# Patient Record
Sex: Female | Born: 1995 | Race: Black or African American | Hispanic: No | Marital: Single | Smoking: Current every day smoker
Health system: Southern US, Community
[De-identification: ages and names within clinical notes are randomized; demographics above are authoritative.]

## PROBLEM LIST (undated history)

## (undated) ENCOUNTER — Emergency Department (HOSPITAL_COMMUNITY): Admission: EM | Payer: Medicaid Other | Source: Home / Self Care

## (undated) DIAGNOSIS — Z789 Other specified health status: Secondary | ICD-10-CM

## (undated) HISTORY — PX: NO PAST SURGERIES: SHX2092

---
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
## 2015-02-26 ENCOUNTER — Encounter (HOSPITAL_COMMUNITY): Payer: Self-pay | Admitting: Emergency Medicine

## 2015-02-26 ENCOUNTER — Emergency Department (HOSPITAL_COMMUNITY)
Admission: EM | Admit: 2015-02-26 | Discharge: 2015-02-26 | Discharge status: Absent without leave | Payer: Self-pay | Source: Home / Self Care

## 2015-02-26 ENCOUNTER — Emergency Department (HOSPITAL_COMMUNITY)
Admission: EM | Admit: 2015-02-26 | Discharge: 2015-02-27 | Disposition: A | Discharge status: Home / Self Care | Payer: Medicaid Other | Attending: Emergency Medicine | Admitting: Emergency Medicine

## 2015-02-26 ENCOUNTER — Emergency Department (HOSPITAL_COMMUNITY)
Admission: EM | Admit: 2015-02-26 | Discharge: 2015-02-26 | Disposition: A | Discharge status: Home / Self Care | Payer: Medicaid Other

## 2015-02-26 DIAGNOSIS — R110 Nausea: Secondary | ICD-10-CM | POA: Insufficient documentation

## 2015-02-26 DIAGNOSIS — Z79899 Other long term (current) drug therapy: Secondary | ICD-10-CM | POA: Insufficient documentation

## 2015-02-26 DIAGNOSIS — J111 Influenza due to unidentified influenza virus with other respiratory manifestations: Secondary | ICD-10-CM | POA: Insufficient documentation

## 2015-02-26 DIAGNOSIS — R109 Unspecified abdominal pain: Secondary | ICD-10-CM | POA: Insufficient documentation

## 2015-02-26 DIAGNOSIS — R69 Illness, unspecified: Secondary | ICD-10-CM

## 2015-02-26 LAB — URINALYSIS, ROUTINE W REFLEX MICROSCOPIC
BILIRUBIN URINE: NEGATIVE
Glucose, UA: NEGATIVE mg/dL
Hgb urine dipstick: NEGATIVE
KETONES UR: 15 mg/dL — AB
LEUKOCYTES UA: NEGATIVE
NITRITE: NEGATIVE
PH: 5.5 (ref 5.0–8.0)
PROTEIN: 100 mg/dL — AB
Specific Gravity, Urine: 1.034 — ABNORMAL HIGH (ref 1.005–1.030)

## 2015-02-26 LAB — CBC
HEMATOCRIT: 39.7 % (ref 36.0–46.0)
HEMOGLOBIN: 12.7 g/dL (ref 12.0–15.0)
MCH: 26 pg (ref 26.0–34.0)
MCHC: 32 g/dL (ref 30.0–36.0)
MCV: 81.4 fL (ref 78.0–100.0)
Platelets: 212 10*3/uL (ref 150–400)
RBC: 4.88 MIL/uL (ref 3.87–5.11)
RDW: 13.4 % (ref 11.5–15.5)
WBC: 3.4 10*3/uL — AB (ref 4.0–10.5)

## 2015-02-26 LAB — COMPREHENSIVE METABOLIC PANEL
ALT: 14 U/L (ref 14–54)
ANION GAP: 9 (ref 5–15)
AST: 31 U/L (ref 15–41)
Albumin: 3.3 g/dL — ABNORMAL LOW (ref 3.5–5.0)
Alkaline Phosphatase: 41 U/L (ref 38–126)
BILIRUBIN TOTAL: 0.2 mg/dL — AB (ref 0.3–1.2)
BUN: 12 mg/dL (ref 6–20)
CO2: 21 mmol/L — ABNORMAL LOW (ref 22–32)
Calcium: 8.8 mg/dL — ABNORMAL LOW (ref 8.9–10.3)
Chloride: 109 mmol/L (ref 101–111)
Creatinine, Ser: 0.75 mg/dL (ref 0.44–1.00)
Glucose, Bld: 88 mg/dL (ref 65–99)
POTASSIUM: 3.5 mmol/L (ref 3.5–5.1)
Sodium: 139 mmol/L (ref 135–145)
TOTAL PROTEIN: 5.8 g/dL — AB (ref 6.5–8.1)

## 2015-02-26 LAB — LIPASE, BLOOD: LIPASE: 56 U/L — AB (ref 11–51)

## 2015-02-26 LAB — URINE MICROSCOPIC-ADD ON

## 2015-02-26 MED ORDER — ONDANSETRON HCL 4 MG/2ML IJ SOLN
4.0000 mg | Freq: Once | INTRAMUSCULAR | Status: AC
  Administered 2015-02-26: 4 mg via INTRAVENOUS
  Filled 2015-02-26: qty 2

## 2015-02-26 MED ORDER — METOCLOPRAMIDE HCL 5 MG/ML IJ SOLN
10.0000 mg | Freq: Once | INTRAMUSCULAR | Status: AC
  Administered 2015-02-26: 10 mg via INTRAVENOUS
  Filled 2015-02-26: qty 2

## 2015-02-26 MED ORDER — ONDANSETRON 4 MG PO TBDP: 4.0000 mg | ORAL_TABLET | Freq: Once | ORAL | Status: DC

## 2015-02-26 MED ORDER — SODIUM CHLORIDE 0.9 % IV BOLUS (SEPSIS): 1000.0000 mL | Freq: Once | INTRAVENOUS | Status: DC

## 2015-02-26 MED ORDER — SODIUM CHLORIDE 0.9 % IV BOLUS (SEPSIS)
1000.0000 mL | Freq: Once | INTRAVENOUS | Status: AC
  Administered 2015-02-26: 1000 mL via INTRAVENOUS

## 2015-02-26 NOTE — ED Notes (Signed)
Pt from home for eval of generalized body aches with nasal congestion, cough, and nausea. Pt states started Friday, denies any sob or cp at this time. Pt has been arnd sick friend.

## 2015-02-26 NOTE — ED Provider Notes (Signed)
CSN: [id number]     Arrival date & time 02/26/15  1512 History   First MD Initiated Contact with Patient 02/26/15 2058     Chief Complaint  Patient presents with  . Generalized Body Aches  . Nausea  . Abdominal Pain    (Consider location/radiation/quality/duration/timing/severity/associated sxs/prior Treatment) HPI Comments: Patient presents with three-day history of URI symptoms, headache, cough, nausea and vomiting. Patient denies chest pain, shortness of breath, abdominal pain. No diarrhea or fever. No urinary symptoms. Patient notes sick contacts. Vomiting is improved today but she still feels very nauseous. She also complains of frontal headache without photophobia or phonophobia. No treatments prior to arrival. Onset of symptoms acute. Course is constant. Nothing makes symptoms better or worse.  Patient is a 20 y.o. female presenting with abdominal pain. The history is provided by the patient.  Abdominal Pain Associated symptoms: cough, fatigue, nausea and vomiting   Associated symptoms: no chest pain, no chills, no diarrhea, no dysuria, no fever, no shortness of breath and no sore throat     History reviewed. No pertinent past medical history. History reviewed. No pertinent past surgical history. No family history on file. Social History  Substance Use Topics  . Smoking status: Never Smoker   . Smokeless tobacco: None  . Alcohol Use: No   OB History    No data available     Review of Systems  Constitutional: Positive for fatigue. Negative for fever and chills.  HENT: Positive for congestion and rhinorrhea. Negative for dental problem, ear pain, sinus pressure and sore throat.   Eyes: Negative for photophobia, discharge, redness and visual disturbance.  Respiratory: Positive for cough. Negative for shortness of breath and wheezing.   Cardiovascular: Negative for chest pain.  Gastrointestinal: Positive for nausea and vomiting. Negative for abdominal pain and diarrhea.   Genitourinary: Negative for dysuria and frequency.  Musculoskeletal: Negative for myalgias, gait problem, neck pain and neck stiffness.  Skin: Negative for rash.  Neurological: Positive for headaches. Negative for syncope, speech difficulty, weakness, light-headedness and numbness.  Hematological: Negative for adenopathy.  Psychiatric/Behavioral: Negative for confusion.    Allergies  Review of patient's allergies indicates no known allergies.  Home Medications   Prior to Admission medications   Medication Sig Start Date End Date Taking? Authorizing Provider  ibuprofen (ADVIL,MOTRIN) 200 MG tablet Take 400 mg by mouth every 6 (six) hours as needed for headache.   Yes Historical Provider, MD  carbamide peroxide (DEBROX) 6.5 % otic solution Place 5 drops into both ears 2 (two) times daily. 10/27/12   Rhetta Mura, MD  diphenhydrAMINE (BENADRYL MAXIMUM STRENGTH) 2 % cream Apply topically 3 (three) times daily as needed for itching. 10/27/12   Rhetta Mura, MD  loratadine (CLARITIN) 5 MG chewable tablet Chew 1 tablet (5 mg total) by mouth daily. 10/27/12   Rhetta Mura, MD  permethrin (ELIMITE) 5 % cream Apply to affected area once, reapply in one week. 11/15/11   Niel Hummer, MD   BP 107/71 mmHg  Pulse 81  Temp(Src) 98.2 F (36.8 C) (Oral)  Resp 16  SpO2 98%   Physical Exam  Constitutional: She appears well-developed and well-nourished.  HENT:  Head: Normocephalic and atraumatic.  Right Ear: Tympanic membrane, external ear and ear canal normal.  Left Ear: Tympanic membrane, external ear and ear canal normal.  Nose: Nose normal. No mucosal edema or rhinorrhea.  Mouth/Throat: Uvula is midline, oropharynx is clear and moist and mucous membranes are normal. Mucous membranes are not dry. No  oral lesions. No trismus in the jaw. No uvula swelling. No oropharyngeal exudate, posterior oropharyngeal edema, posterior oropharyngeal erythema or tonsillar abscesses.  Eyes:  Conjunctivae are normal. Right eye exhibits no discharge. Left eye exhibits no discharge.  Neck: Normal range of motion. Neck supple.  Cardiovascular: Normal rate, regular rhythm and normal heart sounds.   Pulmonary/Chest: Effort normal and breath sounds normal. No respiratory distress. She has no wheezes. She has no rales.  Abdominal: Soft. There is no tenderness. There is no rebound and no guarding.  Lymphadenopathy:    She has no cervical adenopathy.  Neurological: She is alert.  Skin: Skin is warm and dry.  Psychiatric: She has a normal mood and affect.  Nursing note and vitals reviewed.   ED Course  Procedures (including critical care time) Labs Review Labs Reviewed  LIPASE, BLOOD - Abnormal; Notable for the following:    Lipase 56 (*)    All other components within normal limits  COMPREHENSIVE METABOLIC PANEL - Abnormal; Notable for the following:    CO2 21 (*)    Calcium 8.8 (*)    Total Protein 5.8 (*)    Albumin 3.3 (*)    Total Bilirubin 0.2 (*)    All other components within normal limits  CBC - Abnormal; Notable for the following:    WBC 3.4 (*)    All other components within normal limits  URINALYSIS, ROUTINE W REFLEX MICROSCOPIC (NOT AT [facility]) - Abnormal; Notable for the following:    Color, Urine AMBER (*)    APPearance CLOUDY (*)    Specific Gravity, Urine 1.034 (*)    Ketones, ur 15 (*)    Protein, ur 100 (*)    All other components within normal limits  URINE MICROSCOPIC-ADD ON - Abnormal; Notable for the following:    Squamous Epithelial / LPF 6-30 (*)    Bacteria, UA MANY (*)    All other components within normal limits    Imaging Review No results found. I have personally reviewed and evaluated these images and lab results as part of my medical decision-making.   EKG Interpretation None       9:15 PM Patient seen and examined. Tx fluids, reglan/benadryl.   Vital signs reviewed and are as follows: BP 107/71 mmHg  Pulse 81  Temp(Src) 98.2 F  (36.8 C) (Oral)  Resp 16  SpO2 98%  12:32 AM Patient feeling better and requesting discharge. HA improved. Will d/c to home with zofran.   Patient discharged to home. Encouraged to rest and drink plenty of fluids. Patient told to return to ED or see their primary doctor if their symptoms worsen, high fever not controlled with tylenol, persistent vomiting, they feel they are dehydrated, or if they have any other concerns.  Patient verbalized understanding and agreed with plan.    MDM   Final diagnoses:  Influenza-like illness   Patient with symptoms consistent with influenza. Vitals are stable, low-grade fever reported at home. No signs of dehydration, tolerating PO's. Lungs are clear. Supportive therapy indicated with return if symptoms worsen. Patient counseled. Headache without meningismus, altered LOC, or neuro deficit. UA questionable for UTI however she has no urinary sx and no signs of pyelonephritis.      Renne Crigler, PA-C 02/27/15 0036  Benjiman Core, MD 02/27/15 [phone]

## 2015-02-27 MED ORDER — ONDANSETRON 4 MG PO TBDP: 4.0000 mg | ORAL_TABLET | Freq: Three times a day (TID) | ORAL | Status: DC | PRN

## 2015-02-27 NOTE — Discharge Instructions (Signed)
Please read and follow all provided instructions.  Your diagnoses today include:  1. Influenza-like illness    Tests performed today include:  Vital signs. See below for your results today.   Medications prescribed:   Zofran (ondansetron) - for nausea and vomiting  Take any prescribed medications only as directed.  Home care instructions:  Follow any educational materials contained in this packet. Please continue drinking plenty of fluids. Use over-the-counter cold and flu medications as needed as directed on packaging for symptom relief. You may also use ibuprofen or tylenol as directed on packaging for pain or fever.   BE VERY CAREFUL not to take multiple medicines containing Tylenol (also called acetaminophen). Doing so can lead to an overdose which can damage your liver and cause liver failure and possibly death.   Follow-up instructions: Please follow-up with your primary care provider in the next 3 days for further evaluation of your symptoms.   Return instructions:   Please return to the Emergency Department if you experience worsening symptoms.  Please return if you have a high fever greater than 101 degrees not controlled with over-the-counter medications, persistent vomiting and cannot keep down fluids, or worsening trouble breathing.  Please return if you have any other emergent concerns.  Additional Information:  Your vital signs today were: BP 94/76 mmHg   Pulse 86   Temp(Src) 98.2 F (36.8 C) (Oral)   Resp 16   SpO2 100% If your blood pressure (BP) was elevated above 135/85 this visit, please have this repeated by your doctor within one month.

## 2016-05-21 ENCOUNTER — Encounter (HOSPITAL_COMMUNITY): Payer: Self-pay | Admitting: *Deleted

## 2016-05-21 ENCOUNTER — Emergency Department (HOSPITAL_COMMUNITY)
Admission: EM | Admit: 2016-05-21 | Discharge: 2016-05-21 | Disposition: A | Discharge status: Home / Self Care | Payer: BLUE CROSS/BLUE SHIELD | Attending: Emergency Medicine | Admitting: Emergency Medicine

## 2016-05-21 DIAGNOSIS — R220 Localized swelling, mass and lump, head: Secondary | ICD-10-CM | POA: Diagnosis present

## 2016-05-21 DIAGNOSIS — K047 Periapical abscess without sinus: Secondary | ICD-10-CM | POA: Diagnosis not present

## 2016-05-21 MED ORDER — AMOXICILLIN 500 MG PO CAPS: 500.0000 mg | ORAL_CAPSULE | Freq: Three times a day (TID) | ORAL | 0 refills | Status: DC

## 2016-05-21 MED ORDER — TRAMADOL HCL 50 MG PO TABS: 50.0000 mg | ORAL_TABLET | Freq: Four times a day (QID) | ORAL | 0 refills | Status: DC | PRN

## 2016-05-21 NOTE — ED Triage Notes (Signed)
Pt c/o L facial swelling onset yesterday, pt reports possible cavities to L upper teeth, pt has discoloration to inside of mouth on L side, no obvious broken teeth or caries, denies fever & chills, denies inability to swallow, A&O x4

## 2016-05-21 NOTE — ED Provider Notes (Signed)
[facility]PT Provider Note   CSN: [id number] Arrival date & time: 05/21/16  1215     History   Chief Complaint Chief Complaint  Patient presents with  . Facial Swelling    HPI Meghan Warren is a 21 y.o. female who presents emergency Department with chief complaint of left facial swelling. Patient complains of pain in her left upper molar that has been intermittent for some time. She feels that the tooth is a little bit wiggly when she presses it with her tongue. She does smoke daily and smokes both black and mild cigars, but denies cigarette abuse. Patient states that her pain is worse today with swelling into her left upper face. She denies any difficulty swallowing or fevers. She does not have any current dental follow-up.  HPI  History reviewed. No pertinent past medical history.  There are no active problems to display for this patient.   History reviewed. No pertinent surgical history.  OB History    No data available       Home Medications    Prior to Admission medications   Medication Sig Start Date End Date Taking? Authorizing Provider  amoxicillin (AMOXIL) 500 MG capsule Take 1 capsule (500 mg total) by mouth 3 (three) times daily. 05/21/16   Arthor Captain, PA-C  ibuprofen (ADVIL,MOTRIN) 200 MG tablet Take 400 mg by mouth every 6 (six) hours as needed for headache.    [provider]  ondansetron (ZOFRAN ODT) 4 MG disintegrating tablet Take 1 tablet (4 mg total) by mouth every 8 (eight) hours as needed for nausea or vomiting. 02/27/15   Renne Crigler, PA-C  traMADol (ULTRAM) 50 MG tablet Take 1 tablet (50 mg total) by mouth every 6 (six) hours as needed. 05/21/16   Arthor Captain, PA-C    Family History No family history on file.  Social History Social History  Substance Use Topics  . Smoking status: Never Smoker  . Smokeless tobacco: Never Used  . Alcohol use No     Allergies   Patient has no known allergies.   Review of  Systems Review of Systems  Constitutional: Negative for chills and fever.  HENT: Positive for dental problem and facial swelling.      Physical Exam Updated Vital Signs BP 111/84 (BP Location: Right Arm)   Pulse 74   Temp 98.2 F (36.8 C) (Oral)   Resp 14   Ht 5\' 3"  (1.6 m)   LMP 05/21/2016   SpO2 100%   Physical Exam  Constitutional: She is oriented to person, place, and time. She appears well-developed and well-nourished. No distress.  HENT:  Head: Normocephalic and atraumatic.  Left upper second molar is tender to palpation, loose in the joint. No overt swelling or signs of abscess. Patient's face is swollen over the left cheek.  Eyes: Conjunctivae are normal. No scleral icterus.  Neck: Normal range of motion.  Cardiovascular: Normal rate, regular rhythm and normal heart sounds.  Exam reveals no gallop and no friction rub.   No murmur heard. Pulmonary/Chest: Effort normal and breath sounds normal. No respiratory distress.  Abdominal: Soft. Bowel sounds are normal. She exhibits no distension and no mass. There is no tenderness. There is no guarding.  Neurological: She is alert and oriented to person, place, and time.  Skin: Skin is warm and dry. She is not diaphoretic.  Psychiatric: Her behavior is normal.  Nursing note and vitals reviewed.    ED Treatments / Results  Labs (all labs ordered are  listed, but only abnormal results are displayed) Labs Reviewed - No data to display  EKG  EKG Interpretation None       Radiology No results found.  Procedures Procedures (including critical care time)  Medications Ordered in ED Medications - No data to display   Initial Impression / Assessment and Plan / ED Course  I have reviewed the triage vital signs and the nursing notes.  Pertinent labs & imaging results that were available during my care of the patient were reviewed by me and considered in my medical decision making (see chart for details).    Patient  with dentalgia.  No abscess requiring immediate incision and drainage.  Exam not concerning for Ludwig's angina or pharyngeal abscess.  . Pt instructed to follow-up with dentist.  Discussed return precautions. Pt safe for discharge.   Final Clinical Impressions(s) / ED Diagnoses   Final diagnoses:  Dental infection    New Prescriptions New Prescriptions   AMOXICILLIN (AMOXIL) 500 MG CAPSULE    Take 1 capsule (500 mg total) by mouth 3 (three) times daily.   TRAMADOL (ULTRAM) 50 MG TABLET    Take 1 tablet (50 mg total) by mouth every 6 (six) hours as needed.     Arthor CaptainHarris, Jaymes Revels, PA-C 05/21/16 1327    Jacalyn LefevreHaviland, Julie, MD 05/21/16 [phone]

## 2016-05-21 NOTE — Discharge Instructions (Signed)
You have been diagnosed with Dental pain. Please call the follow up dentist first thing in the morning on Monday for a follow up appointment. Keep your discharge paperwork from today's visit to bring to the dentist office. You may also use the resource guide listed below to help you find a dentist if you do not already have one to followup with. It is very important that you get evaluated by a dentist as soon as possible.  Use your pain medication as prescribed and do not operate heavy machinery while on pain medication. Note that your pain medication contains acetaminophen (Tylenol) & its is not reccommended that you use additional acetaminophen (Tylenol) while taking this medication. Take your full course of antibiotics. Read the instructions below. ° °Eat a soft or liquid diet and rinse your mouth out after meals with warm water. You should see a dentist or return here at once if you have increased swelling, increased pain or uncontrolled bleeding from the site of your injury. ° ° °SEEK MEDICAL CARE IF:  °· You have increased pain not controlled with medicines.  °· You have swelling around your tooth, in your face or neck.  °· You have bleeding which starts, continues, or gets worse.  °· You have a fever >101 °· If you are unable to open your mouth °Soft Diet  °The soft diet may be recommended after you were put on a full liquid diet. A normal diet may follow. The soft diet can also be used after surgery if you are too ill to keep down a normal diet. The soft diet may also be needed if you have a hard time chewing foods.  °DESCRIPTION  °Tender foods are used. Foods do not need to be ground or pureed. Most raw fruits and vegetables and coarse breads and cereals should be avoided. Fried foods and highly seasoned foods may cause discomfort.  °NUTRITIONAL ADEQUACY  °A healthy diet is possible if foods from each of the basic food groups are eaten daily.  °SOFT DIET FOOD LISTS  °Milk/Dairy  °Allowed: Milk and milk  drinks, milk shakes, cream cheese, cottage cheese, mild cheeses.  °Avoid: Sharp or highly seasoned cheese. °Meat/Meat Substitutes  °Allowed: Broiled, roasted, baked, or stewed tender lean beef, mutton, lamb, veal, chicken, turkey, liver, ham, crisp bacon, white fish, tuna, salmon. Eggs, smooth peanut butter.  °Avoid: All fried meats, fish, or fowl. Rich gravies and sauces. Lunch meats, sausages, hot dogs. Meats with gristle, chunky peanut butter. °Breads/Grains  °Allowed: Rice, noodles, spaghetti, macaroni. Dry or cooked refined cereals, such as farina, cream of wheat, oatmeal, grits, whole-wheat cereals. Plain or toasted white or wheat blend or whole-grain breads, soda crackers or saltines, flour tortillas.  °Avoid: Wild rice, coarse cereals, such as bran. Seed in or on breads and crackers. Bread or bread products with nuts or seeds. °Fruits/Vegetables  °Allowed: Fruit and vegetable juices, well-cooked or canned fruits and vegetables, any dried fruit. One citrus fruit daily, 1 vitamin A source daily. Well-ripened, easy to chew fruits, sweet potatoes. Baked, boiled, mashed, creamed, scalloped, or au gratin potatoes. Broths or creamed soups made with allowed vegetables, strained tomatoes.  °Avoid: All gas-forming vegetables (corn, radishes, Brussels sprouts, onions, broccoli, cabbage, parsnips, turnips, chili peppers, pinto beans, split peas, dried beans). Fruits containing seeds and skin. Potato chips and corn chips. All others that are not made with allowed vegetables. Highly seasoned soups. °Desserts/Sweets  °Allowed: Simple desserts, such as custard, junkets, gelatin desserts, plain ice cream and sherbets, simple cakes   and cookies, allowed fruits, sugar, syrup, jelly, honey, plain hard candy, and molasses.  °Avoid: Rich pastries, any dessert containing dates, nuts, raisins, or coconut. Fried pastries, such as doughnuts. Chocolate. °Beverages  °Allowed: Fruit and vegetable juices. Caffeine-free carbonated drinks,  coffee, and tea.  °Avoid: Caffeinated beverages: coffee, tea, soda or pop. °Miscellaneous  °Allowed: Butter, cream, margarine, mayonnaise, oil. Cream sauces, salt, and mild spices.  °Avoid: Highly spiced salad dressings. Highly seasoned foods, hot sauce, mustard, horseradish, and pepper. °SAMPLE MENU  °Breakfast  °Orange juice.  °Oatmeal.  °Soft cooked egg.  °Toast and margarine.  °2% milk.  °Coffee. °Lunch  °Meatloaf.  °Mashed potato.  °Green beans.  °Lemon pudding.  °Bread and margarine.  °Coffee. °Dinner  °Consommé or apricot nectar.  °Chicken breast.  °Rice, peas, and carrots.  °Applesauce.  °Bread and margarine.  °2% milk. °To cut the amount of fat in your diet, omit margarine and use 1% or skim milk.  °NUTRIENT ANALYSIS  °Calories........................1953 Kcal.  °Protein.........................102 gm.  °Carbohydrate...............247 gm.  °Fat................................65 gm.  °Cholesterol...................449 mg.  °Dietary fiber.................19 gm.  °Vitamin A.....................2944 RE.  °Vitamin C.....................79 mg.  °Niacin..........................25 mg.  °Riboflavin....................2.0 mg.  °Thiamin.......................1.5 mg.  °Folate..........................249 mcg.  °Calcium.......................1030 mg.  °Phosphorus.................1782 mg.  °Zinc..............................12 mg.  °Iron..............................13 mg.  °Sodium.........................299 mg.  °Potassium....................3046 mg. °Document Released: 04/01/2007 Document Revised: 03/17/2011 Document Reviewed: 04/01/2007  °ExitCare® Patient Information ©2014 [employer].  ° °RESOURCE GUIDE ° ° °Dental Problems ° °Dr. Janna Civilis °$200 dollar visit °[address] °[place], [place] [address]  °[phone] °  ° °Patients with Medicaid: °[facility]                     [facility] °[address]                                           [address] °Phone:   [phone]                                                  Phone:  [phone] ° °If unable to pay or uninsured, contact:  [facility] or [facility] to become qualified for the adult dental clinic. ° °Chronic Pain Problems °Contact [facility]  [phone] °Patients need to be referred by their primary care doctor. ° °Insufficient Money for Medicine °Contact [employer]:  call "211" or [facility] 271-5999. ° °No Primary Care Doctor °Call [facility]  832-8000 °Other agencies that provide inexpensive medical care °   [facility]  [phone] °   [facility]  [phone] °   [facility]  [phone] °   [facility]  [phone] °   [facility]  [phone] °   [facility]  [phone] ° °Psychological Services °[facility]  [phone] °[facility]  [phone] °[facility]   [phone] (emergency services [phone]) ° °Substance Abuse Resources °[facility]  336-882-2125 °[facility] 336-784-9470 °[facility] 336-285-9073 °[facility] 336-845-3988 °Residential & Outpatient Substance Abuse Program  800-659-3381 ° °Abuse/Neglect °[place] Child Abuse Hotline (336) 641-3795 °[place] Child Abuse Hotline 800-378-5315 (After Hours) ° °Emergency Shelter °[place]   [facility] [place] [facility] [place] (413)749-0761(336) [phone] [facility] 815-502-7214(704) [phone]  MRSA Hotline #:   8540010291450 231 1945    [facility]  [facility] [place]     [employer]                          [facility] [address]                       [address]      [address] [place] [address]  [place]                                                [place]                            [place] Phone:  [phone]                                   Phone:  [phone]                 Phone:   [phone]  [facility] Phone:  (780) [phone](704) [phone]  [facility] Hotline 7017132019(336) 941-407-4611 (726) 654-5924(336) (952) 357-5312 (After Hours)  [facility]  [employer]  [facility]  [address]  [place], [place] [address]  Phone [phone]  [facility] [employer] [facility] [place] exemplifies the Dental School?s vision to improve the health and quality of life of all [place] by Public house managercreating leaders with a passion to care for the underserved and by leading the nation in community-based, service learning oral health education. We are committed to offering comprehensive general dental services for adults, children and special needs patients in a safe, caring and professional setting.  Appointments: Our clinic is open Monday through Friday 8:00 a.m. until 5:00 p.m. The amount of time scheduled for an appointment depends on the patient?s specific needs. We ask that you keep your appointed time for care or provide 24-hour notice of all appointment changes. Parents or legal guardians must accompany minor children.  Payment for Services: Medicaid and other insurance plans are welcome. Payment for services is due when services are rendered and may be made by cash or credit card. If you have dental insurance, we will assist you with your claim submission.   Emergencies: Emergency services will be provided Monday through Friday on a walk-in basis. Please arrive early for emergency services. After hours emergency services will be provided for patients of record as required.  Services:  Comprehensive General Dentistry  Children?s Dentistry  Oral Surgery - Extractions  Root Canals  Sealants and Tooth Colored Fillings  [facility] and [employer] engineer[employer]ridges  Dentures and Partial Dentures  Implant Services  Periodontal Services and Cleanings  Cosmetic Tooth  Whitening  Digital Radiography  3-D/[facility]

## 2016-05-22 ENCOUNTER — Emergency Department (HOSPITAL_COMMUNITY)
Admission: EM | Admit: 2016-05-22 | Discharge: 2016-05-22 | Disposition: A | Discharge status: Home / Self Care | Payer: BLUE CROSS/BLUE SHIELD | Attending: Emergency Medicine | Admitting: Emergency Medicine

## 2016-05-22 ENCOUNTER — Encounter (HOSPITAL_COMMUNITY): Payer: Self-pay | Admitting: Emergency Medicine

## 2016-05-22 ENCOUNTER — Emergency Department (HOSPITAL_COMMUNITY): Payer: BLUE CROSS/BLUE SHIELD

## 2016-05-22 DIAGNOSIS — K047 Periapical abscess without sinus: Secondary | ICD-10-CM | POA: Diagnosis not present

## 2016-05-22 DIAGNOSIS — Z79899 Other long term (current) drug therapy: Secondary | ICD-10-CM | POA: Insufficient documentation

## 2016-05-22 DIAGNOSIS — R220 Localized swelling, mass and lump, head: Secondary | ICD-10-CM | POA: Diagnosis present

## 2016-05-22 LAB — CBC WITH DIFFERENTIAL/PLATELET
Basophils Absolute: 0 10*3/uL (ref 0.0–0.1)
Basophils Relative: 0 %
EOS ABS: 0.3 10*3/uL (ref 0.0–0.7)
Eosinophils Relative: 2 %
HEMATOCRIT: 37.3 % (ref 36.0–46.0)
HEMOGLOBIN: 11.9 g/dL — AB (ref 12.0–15.0)
LYMPHS ABS: 1.5 10*3/uL (ref 0.7–4.0)
Lymphocytes Relative: 14 %
MCH: 26.3 pg (ref 26.0–34.0)
MCHC: 31.9 g/dL (ref 30.0–36.0)
MCV: 82.5 fL (ref 78.0–100.0)
MONO ABS: 0.9 10*3/uL (ref 0.1–1.0)
MONOS PCT: 9 %
NEUTROS ABS: 8.1 10*3/uL — AB (ref 1.7–7.7)
Neutrophils Relative %: 75 %
Platelets: 286 10*3/uL (ref 150–400)
RBC: 4.52 MIL/uL (ref 3.87–5.11)
RDW: 13 % (ref 11.5–15.5)
WBC: 10.8 10*3/uL — ABNORMAL HIGH (ref 4.0–10.5)

## 2016-05-22 LAB — I-STAT BETA HCG BLOOD, ED (MC, WL, AP ONLY): I-stat hCG, quantitative: <5 m[IU]/mL (ref <5)

## 2016-05-22 LAB — BASIC METABOLIC PANEL
ANION GAP: 9 (ref 5–15)
BUN: 7 mg/dL (ref 6–20)
CALCIUM: 8.9 mg/dL (ref 8.9–10.3)
CO2: 22 mmol/L (ref 22–32)
CREATININE: 0.58 mg/dL (ref 0.44–1.00)
Chloride: 105 mmol/L (ref 101–111)
GFR calc non Af Amer: >60 mL/min (ref >60)
Glucose, Bld: 100 mg/dL — ABNORMAL HIGH (ref 65–99)
Potassium: 3.5 mmol/L (ref 3.5–5.1)
Sodium: 136 mmol/L (ref 135–145)

## 2016-05-22 MED ORDER — KETOROLAC TROMETHAMINE 30 MG/ML IJ SOLN
30.0000 mg | Freq: Once | INTRAMUSCULAR | Status: AC
  Administered 2016-05-22: 30 mg via INTRAVENOUS
  Filled 2016-05-22: qty 1

## 2016-05-22 MED ORDER — CLINDAMYCIN PHOSPHATE 600 MG/50ML IV SOLN
600.0000 mg | Freq: Once | INTRAVENOUS | Status: AC
  Administered 2016-05-22: 600 mg via INTRAVENOUS
  Filled 2016-05-22: qty 50

## 2016-05-22 MED ORDER — CLINDAMYCIN HCL 150 MG PO CAPS
300.0000 mg | ORAL_CAPSULE | Freq: Three times a day (TID) | ORAL | 0 refills | Status: DC
Start: 2016-05-22 — End: 2017-12-22

## 2016-05-22 MED ORDER — IOPAMIDOL (ISOVUE-300) INJECTION 61%
INTRAVENOUS | Status: AC
  Administered 2016-05-22: 75 mL
  Filled 2016-05-22: qty 75

## 2016-05-22 NOTE — ED Notes (Signed)
Patient transported to CT 

## 2016-05-22 NOTE — ED Provider Notes (Signed)
[facility] Provider Note   CSN: [id number] Arrival date & time: 05/22/16  0758     History   Chief Complaint Chief Complaint  Patient presents with  . Facial Swelling  . Allergic Reaction    HPI Meghan Warren is a 21 y.o. female.  21 year old female who presents with facial swelling. The patient presented to the ED yesterday for left facial swelling as well as recent left upper tooth pain. She was suspected to have a dental infection and was started on amoxicillin. She started that medication yesterday but today she woke up with worsening swelling on her face. She continues to have moderate, constant pain. She denies any fever, swallowing problems, or breathing difficulty. She has not yet been to a dentist.   The history is provided by the patient.  Allergic Reaction    History reviewed. No pertinent past medical history.  There are no active problems to display for this patient.   History reviewed. No pertinent surgical history.  OB History    No data available       Home Medications    Prior to Admission medications   Medication Sig Start Date End Date Taking? Authorizing Provider  amoxicillin (AMOXIL) 500 MG capsule Take 1 capsule (500 mg total) by mouth 3 (three) times daily. 05/21/16  Yes Harris, Abigail, PA-C  traMADol (ULTRAM) 50 MG tablet Take 1 tablet (50 mg total) by mouth every 6 (six) hours as needed. 05/21/16  Yes Harris, Abigail, PA-C  clindamycin (CLEOCIN) 150 MG capsule Take 2 capsules (300 mg total) by mouth 3 (three) times daily. 05/22/16   Forbes Loll, Ambrose Finlandachel Morgan, MD  ondansetron (ZOFRAN ODT) 4 MG disintegrating tablet Take 1 tablet (4 mg total) by mouth every 8 (eight) hours as needed for nausea or vomiting. Patient not taking: Reported on 05/22/2016 02/27/15   Renne CriglerGeiple, Joshua, PA-C    Family History No family history on file.  Social History Social History  Substance Use Topics  . Smoking status: Never Smoker  . Smokeless tobacco:  Never Used  . Alcohol use No     Allergies   Patient has no active allergies.   Review of Systems Review of Systems All other systems reviewed and are negative except that which was mentioned in HPI  Physical Exam Updated Vital Signs BP 125/84   Pulse 72   Resp 16   Ht 5\' 3"  (1.6 m)   Wt 148 lb (67.1 kg)   LMP 05/21/2016   SpO2 100%   BMI 26.22 kg/m   Physical Exam  Constitutional: She is oriented to person, place, and time. She appears well-developed and well-nourished. No distress.  HENT:  Head: Normocephalic.  Nose: Nose normal.  Mouth/Throat: Oropharynx is clear and moist.    Tenderness of L upper molars without obvious gumline swelling or fluctuance; edema involving L cheek up to below L eye with mild tenderness; Moist mucous membranes  Eyes: Conjunctivae are normal. Pupils are equal, round, and reactive to light.  Neck: Normal range of motion. Neck supple.  Cardiovascular: Normal rate, regular rhythm and normal heart sounds.   No murmur heard. Pulmonary/Chest: Effort normal and breath sounds normal.  Abdominal: Soft. Bowel sounds are normal. She exhibits no distension. There is no tenderness.  Musculoskeletal: She exhibits no edema.  Lymphadenopathy:    She has cervical adenopathy.  Neurological: She is alert and oriented to person, place, and time.  Fluent speech  Skin: Skin is warm and dry.  Psychiatric: She has a normal  mood and affect. Judgment normal.  Nursing note and vitals reviewed.    ED Treatments / Results  Labs (all labs ordered are listed, but only abnormal results are displayed) Labs Reviewed  BASIC METABOLIC PANEL - Abnormal; Notable for the following:       Result Value   Glucose, Bld 100 (*)    All other components within normal limits  CBC WITH DIFFERENTIAL/PLATELET - Abnormal; Notable for the following:    WBC 10.8 (*)    Hemoglobin 11.9 (*)    Neutro Abs 8.1 (*)    All other components within normal limits  I-STAT BETA HCG  BLOOD, ED ([facility], [facility], [facility] ONLY)    EKG  EKG Interpretation None       Radiology Ct Maxillofacial W Contrast  Result Date: 05/22/2016 CLINICAL DATA:  Left facial swelling beginning 4 days ago. Patient is on antibiotics with no improvement EXAM: CT MAXILLOFACIAL WITH CONTRAST TECHNIQUE: Multidetector CT imaging of the maxillofacial structures was performed. Multiplanar CT image reconstructions were also generated. A small metallic BB was placed on the right temple in order to reliably differentiate right from left. COMPARISON:  None. FINDINGS: Cellulitic changes in the left face contiguous with subperiosteal abscess along the buccal surface of the left alveolar process, which measures 12 x 12 x 4 mm. The offending tooth is number 13, which is devitalized with periapical erosion. Osseous: No evidence of osteomyelitis or trauma. Orbits: Negative.  No inflammation noted. Sinuses: Mucosal thickening focally in the inferior left maxillary sinus, likely odontogenic. Soft tissues: Left face cellulitis as described above. Reactive appearing cervical lymph node enlargement. Limited intracranial: Negative IMPRESSION: 1. Odontogenic infection in the left face related to tooth #13. A subperiosteal abscess on the outer alveolar process measures 12 x 12 x 4 mm. 2. Sinusitis in the inferior left maxillary antrum, likely odontogenic. Electronically Signed   By: Marnee Spring M.D.   On: 05/22/2016 11:39    Procedures Procedures (including critical care time)  Medications Ordered in ED Medications  ketorolac (TORADOL) 30 MG/ML injection 30 mg (not administered)  clindamycin (CLEOCIN) IVPB 600 mg (0 mg Intravenous Stopped 05/22/16 0938)  iopamidol (ISOVUE-300) 61 % injection (75 mLs  Contrast Given 05/22/16 1106)     Initial Impression / Assessment and Plan / ED Course  I have reviewed the triage vital signs and the nursing notes.  Pertinent labs & imaging results that were available during my care of the  patient were reviewed by me and considered in my medical decision making (see chart for details).    Pt w/ worsening left facial swelling despite starting antibiotics yesterday for suspected tooth infection. She was nontoxic on exam with normal vital signs. She had left facial swelling involving cheek, just below the left eyelid, down to angle of mandible. She reported tenderness of left upper molars but had no obvious cracked teeth or gumline swelling. Because of her worsening symptoms despite initiation of antibiotics, obtained lab work and CT. Gave dose of IV clindamycin.  Labwork overall reassuring, WBC 10.8. CT shows odontogenic infection with sub periostial abscess with associated sinusitis. I discussed with oral surgeon on call, Dr. Kenney Houseman; I appreciate his assistance with the patient's care. He will see the patient in the clinic today or tomorrow and I have provided her with his contact information. She understands the importance of follow-up. I have also instructed her to switch her antibiotic from amoxicillin to clindamycin. She understands return precautions and will follow-up in the oral surgery clinic.  Patient discharged in satisfactory condition.  Final Clinical Impressions(s) / ED Diagnoses   Final diagnoses:  Dental abscess  Left facial swelling    New Prescriptions New Prescriptions   CLINDAMYCIN (CLEOCIN) 150 MG CAPSULE    Take 2 capsules (300 mg total) by mouth 3 (three) times daily.     Dessa Ledee, Ambrose Finland, MD 05/22/16 [id number]

## 2016-05-22 NOTE — ED Triage Notes (Signed)
Pt in from home, seen yesterday for same. C/o L facial swelling and pain to L face, worse today. Pt states she was given antibiotic yesterday, woke up today with worsened swelling. A&ox4, denies trouble swallowing or throat tightness

## 2017-12-22 ENCOUNTER — Inpatient Hospital Stay (HOSPITAL_COMMUNITY)
Admission: AD | Admit: 2017-12-22 | Discharge: 2017-12-22 | Disposition: A | Discharge status: Home / Self Care | Payer: BLUE CROSS/BLUE SHIELD | Source: Ambulatory Visit | Attending: Family Medicine | Admitting: Family Medicine

## 2017-12-22 ENCOUNTER — Encounter (HOSPITAL_COMMUNITY): Payer: Self-pay | Admitting: *Deleted

## 2017-12-22 DIAGNOSIS — N939 Abnormal uterine and vaginal bleeding, unspecified: Secondary | ICD-10-CM | POA: Insufficient documentation

## 2017-12-22 LAB — URINALYSIS, ROUTINE W REFLEX MICROSCOPIC
Bilirubin Urine: NEGATIVE
Glucose, UA: NEGATIVE mg/dL
Ketones, ur: NEGATIVE mg/dL
Nitrite: NEGATIVE
PROTEIN: 30 mg/dL — AB
SPECIFIC GRAVITY, URINE: 1.009 (ref 1.005–1.030)
pH: 6 (ref 5.0–8.0)

## 2017-12-22 LAB — CBC
HEMATOCRIT: 37.5 % (ref 36.0–46.0)
Hemoglobin: 11.8 g/dL — ABNORMAL LOW (ref 12.0–15.0)
MCH: 28.1 pg (ref 26.0–34.0)
MCHC: 31.5 g/dL (ref 30.0–36.0)
MCV: 89.3 fL (ref 80.0–100.0)
NRBC: 0 % (ref 0.0–0.2)
PLATELETS: 295 10*3/uL (ref 150–400)
RBC: 4.2 MIL/uL (ref 3.87–5.11)
RDW: 13.3 % (ref 11.5–15.5)
WBC: 8.6 10*3/uL (ref 4.0–10.5)

## 2017-12-22 LAB — POCT PREGNANCY, URINE: PREG TEST UR: NEGATIVE

## 2017-12-22 NOTE — MAU Provider Note (Signed)
Chief Complaint:  Vaginal Bleeding   First Provider Initiated Contact with Patient 12/22/17 2012      HPI: Meghan Warren is a 22 y.o. G0P0 who presents to maternity admissions reporting two periods this month.  Had a period Dec 5 and again this week.  Mild cramping.  Usually very regular.  Has not been on contraception in a year.  She reports vaginal bleeding, but no vaginal itching/burning, urinary symptoms, h/a, dizziness, n/v, or fever/chills.    Vaginal Bleeding  The patient's primary symptoms include vaginal bleeding. The patient's pertinent negatives include no genital itching, genital lesions or genital odor. The current episode started in the past 7 days. The problem occurs intermittently. The pain is mild. The problem affects both sides. She is not pregnant. Pertinent negatives include no back pain, chills, constipation, diarrhea, dysuria, fever or headaches. The vaginal discharge was bloody. The vaginal bleeding is lighter than menses. She has not been passing clots. She has not been passing tissue. Nothing aggravates the symptoms. She has tried nothing for the symptoms.    Past Medical History: History reviewed. No pertinent past medical history.  Past obstetric history: OB History  No obstetric history on file.    Past Surgical History: History reviewed. No pertinent surgical history.  Family History: History reviewed. No pertinent family history.  Social History: Social History   Tobacco Use  . Smoking status: Never Smoker  . Smokeless tobacco: Never Used  Substance Use Topics  . Alcohol use: No  . Drug use: No    Allergies: No Active Allergies  Meds:  Medications Prior to Admission  Medication Sig Dispense Refill Last Dose  . amoxicillin (AMOXIL) 500 MG capsule Take 1 capsule (500 mg total) by mouth 3 (three) times daily. 21 capsule 0 05/21/2016 at Unknown time  . clindamycin (CLEOCIN) 150 MG capsule Take 2 capsules (300 mg total) by mouth 3 (three) times  daily. 42 capsule 0   . ondansetron (ZOFRAN ODT) 4 MG disintegrating tablet Take 1 tablet (4 mg total) by mouth every 8 (eight) hours as needed for nausea or vomiting. (Patient not taking: Reported on 05/22/2016) 10 tablet 0 Not Taking at Unknown time  . traMADol (ULTRAM) 50 MG tablet Take 1 tablet (50 mg total) by mouth every 6 (six) hours as needed. 15 tablet 0 05/21/2016 at Unknown time    I have reviewed patient's Past Medical Hx, Surgical Hx, Family Hx, Social Hx, medications and allergies.  ROS:  Review of Systems  Constitutional: Negative for chills and fever.  Gastrointestinal: Negative for constipation and diarrhea.  Genitourinary: Positive for vaginal bleeding. Negative for dysuria.  Musculoskeletal: Negative for back pain.  Neurological: Negative for headaches.   Other systems negative    Physical Exam   Patient Vitals for the past 24 hrs:  BP Temp Temp src Pulse Resp SpO2 Height Weight  12/22/17 1758 (!) 110/59 98.5 F (36.9 C) Oral 77 16 100 % 5' 3.5" (1.613 m) 65.3 kg   Constitutional: Well-developed, well-nourished female in no acute distress.  Cardiovascular: normal rate and rhythm, no ectopy audible, S1 & S2 heard, no murmur Respiratory: normal effort, no distress. Lungs CTAB with no wheezes or crackles GI: Abd soft, non-tender.  Nondistended.  No rebound, No guarding.  Bowel Sounds audible  MS: Extremities nontender, no edema, normal ROM Neurologic: Alert and oriented x 4.   Grossly nonfocal. GU: Neg CVAT. Skin:  Warm and Dry Psych:  Affect appropriate.  PELVIC EXAM: Cervix pink, visually closed, without  lesion, scant red discharge, vaginal walls and external genitalia normal Bimanual exam: Cervix firm, anterior, neg CMT, uterus nontender, nonenlarged, adnexa without tenderness, enlargement, or mass    Labs: Results for orders placed or performed during the hospital encounter of 12/22/17 (from the past 24 hour(s))  Urinalysis, Routine w reflex microscopic      Status: Abnormal   Collection Time: 12/22/17  6:09 PM  Result Value Ref Range   Color, Urine YELLOW YELLOW   APPearance CLEAR CLEAR   Specific Gravity, Urine 1.009 1.005 - 1.030   pH 6.0 5.0 - 8.0   Glucose, UA NEGATIVE NEGATIVE mg/dL   Hgb urine dipstick LARGE (A) NEGATIVE   Bilirubin Urine NEGATIVE NEGATIVE   Ketones, ur NEGATIVE NEGATIVE mg/dL   Protein, ur 30 (A) NEGATIVE mg/dL   Nitrite NEGATIVE NEGATIVE   Leukocytes, UA SMALL (A) NEGATIVE   RBC / HPF 21-50 0 - 5 RBC/hpf   WBC, UA 11-20 0 - 5 WBC/hpf   Bacteria, UA FEW (A) NONE SEEN   Squamous Epithelial / LPF 0-5 0 - 5   Mucus PRESENT   Pregnancy, urine POC     Status: None   Collection Time: 12/22/17  6:12 PM  Result Value Ref Range   Preg Test, Ur NEGATIVE NEGATIVE  CBC     Status: Abnormal   Collection Time: 12/22/17  6:37 PM  Result Value Ref Range   WBC 8.6 4.0 - 10.5 K/uL   RBC 4.20 3.87 - 5.11 MIL/uL   Hemoglobin 11.8 (L) 12.0 - 15.0 g/dL   HCT 69.637.5 29.536.0 - 28.446.0 %   MCV 89.3 80.0 - 100.0 fL   MCH 28.1 26.0 - 34.0 pg   MCHC 31.5 30.0 - 36.0 g/dL   RDW 13.213.3 44.011.5 - 10.215.5 %   Platelets 295 150 - 400 K/uL   nRBC 0.0 0.0 - 0.2 %      Imaging:  No results found.  MAU Course/MDM: I have ordered labs as follows:  CBC, UA, UPT Imaging ordered: none Results reviewed. Hemoglobin stable when compared to last year.  .   Treatments in MAU included none.   Pt stable at time of discharge.  Assessment: Abnormal Uterine Bleeding  Plan: Discharge home Discussed anovulatory bleeding, and that this will likely resolve in a month or so Discussed contraception, as she does not want to get pregnant.  Recommend Establish care with a Gynecologist, list provided  Encouraged to return here or to other Urgent Care/ED if she develops worsening of symptoms, increase in pain, fever, or other concerning symptoms.   Wynelle BourgeoisMarie Aimar Borghi CNM, MSN Certified Nurse-Midwife 12/22/2017 8:12 PM

## 2017-12-22 NOTE — MAU Note (Signed)
Pt reports "real bad bleeding problems". Had a period on 12/10/17 and had bleeding again this past weekend

## 2017-12-22 NOTE — Discharge Instructions (Signed)
Abnormal Uterine Bleeding Abnormal uterine bleeding means bleeding more than usual from your uterus. It can include:  Bleeding between periods.  Bleeding after sex.  Bleeding that is heavier than normal.  Periods that last longer than usual.  Bleeding after you have stopped having your period (menopause).  There are many problems that may cause this. You should see a doctor for any kind of bleeding that is not normal. Treatment depends on the cause of the bleeding. Follow these instructions at home:  Watch your condition for any changes.  Do not use tampons, douche, or have sex, if your doctor tells you not to.  Change your pads often.  Get regular well-woman exams. Make sure they include a pelvic exam and cervical cancer screening.  Keep all follow-up visits as told by your doctor. This is important. Contact a doctor if:  The bleeding lasts more than one week.  You feel dizzy at times.  You feel like you are going to throw up (nauseous).  You throw up. Get help right away if:  You pass out.  You have to change pads every hour.  You have belly (abdominal) pain.  You have a fever.  You get sweaty.  You get weak.  You passing large blood clots from your vagina. Summary  Abnormal uterine bleeding means bleeding more than usual from your uterus.  There are many problems that may cause this. You should see a doctor for any kind of bleeding that is not normal.  Treatment depends on the cause of the bleeding. This information is not intended to replace advice given to you by your health care provider. Make sure you discuss any questions you have with your health care provider. Document Released: 10/20/2008 Document Revised: 12/18/2015 Document Reviewed: 12/18/2015 Elsevier Interactive Patient Education  2017 Elsevier Inc.  MAU/ER VISITS:  In late 2019, the [facility] will be moving to the [facility] campus. At that time, the MAU (Maternity Admissions  Unit), where you are being seen today, will no longer take care of non-pregnant patients. We strongly encourage you to find a doctor's office before that time, so that you can be seen with any GYN concerns, like vaginal discharge, urinary tract infection, etc.. in a timely manner.  In order to make an office visit more convenient, the Center for [facility] at [facility] will be offering evening hours with same-day appointments, walk-in appointments and scheduled appointments available during this time.  Center for [facility] @ [facility] Hours: Monday - 8am - 7:30 pm with walk-in between 4pm- 7:30 pm Tuesday - 8 am - 5 pm (starting 04/07/17 we will be open late and accepting walk-ins from 4pm - 7:30pm) Wednesday - 8 am - 5 pm (starting 07/08/17 we will be open late and accepting walk-ins from 4pm - 7:30pm) Thursday 8 am - 5 pm (starting 10/08/17 we will be open late and accepting walk-ins from 4pm - 7:30pm) Friday 8 am - 5 pm  For an appointment please call the Center for [facility] @ [facility] at [phone]  For urgent needs, [name] is also available for management of urgent GYN complaints such as vaginal discharge or urinary tract infections.  [employer] [facility] [employer] [facility] [employer] at [facility]       Phone: [phone]  Center for [employer] at [place] Phone: [phone]  Center for [employer] at [place]  Phone: [phone]  Center for [facility] at [facility]  Phone: [phone]  Center for [facility]  Phone: [phone]  Center for [facility]       Address: [address], [place], [place] [address]  Phone: [phone]  [facility]       Phone: [phone]  [facility]    Phone: [phone]   [facility]    Phone: [phone]  [facility]    Phone: [phone]  [facility]    Phone: [phone]  [facility]    Phone: [phone]  [facility]       Phone: [phone]   [facility]  Phone: [phone]  [facility]    Phone: [phone]  [facility]   Phone: [phone]  [facility]      Phone: [phone]  [facility]    Phone: [phone]

## 2018-02-04 IMAGING — CT CT MAXILLOFACIAL W/ CM
3 series · 16 of 47 positions shown, 19 images · IV contrast (agent unspecified)
Comparison: None.

CLINICAL DATA: Left facial swelling beginning 4 days ago. Patient
is on antibiotics with no improvement

EXAM:
CT MAXILLOFACIAL WITH CONTRAST
TECHNIQUE: Multidetector CT imaging of the maxillofacial structures was
performed. Multiplanar CT image reconstructions were also generated.
A small metallic BB was placed on the right temple in order to
reliably differentiate right from left.

[Series 3: facial/ orbits 2.0 h30s · axial · 0.36mm/px · z∈[+1040,+1172]mm · 10 of 78 slices shown, 13 images]
[im 6/78  brain]
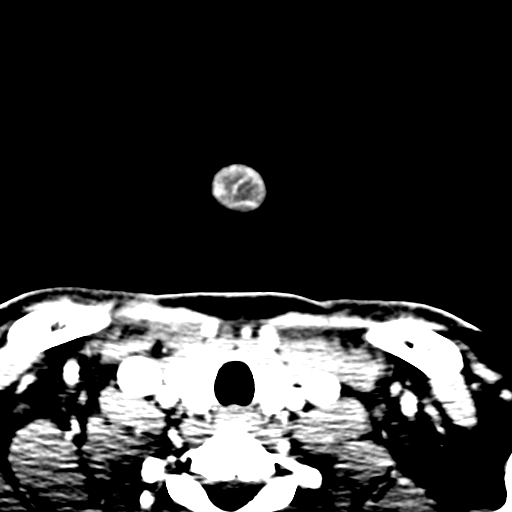
[im 6/78  bone]
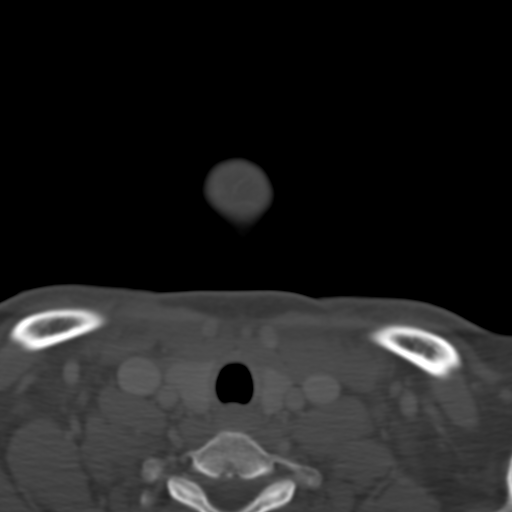
[im 14/78  bone]
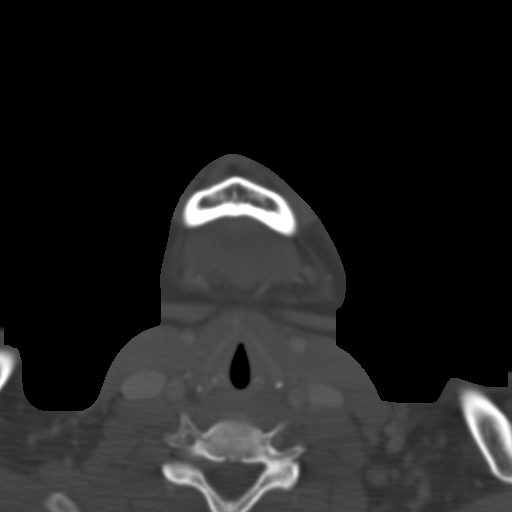
[im 22/78  bone]
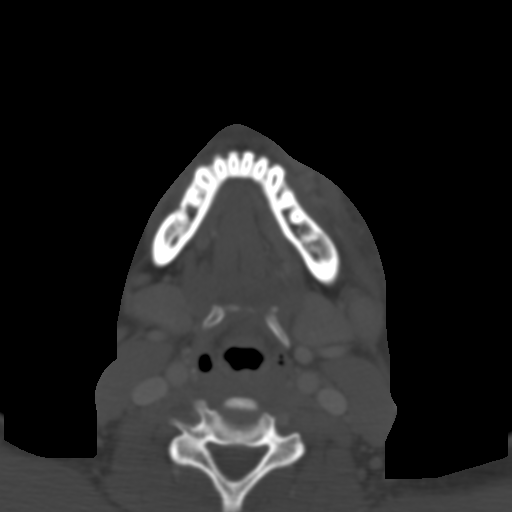
[im 27/78  bone]
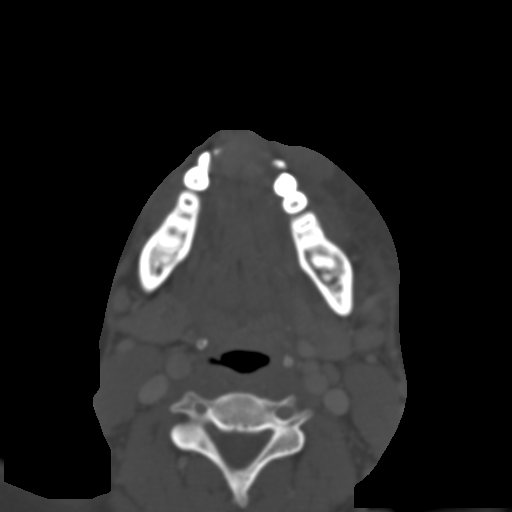
[im 35/78  brain]
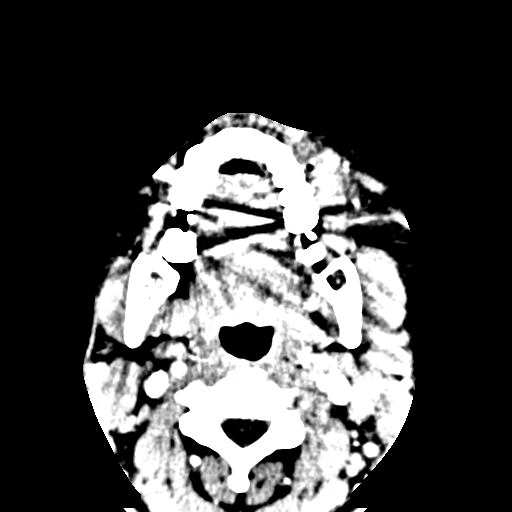
[im 35/78  bone]
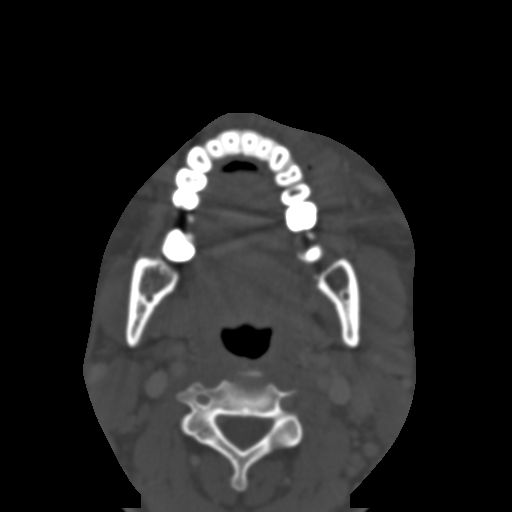
[im 43/78  bone]
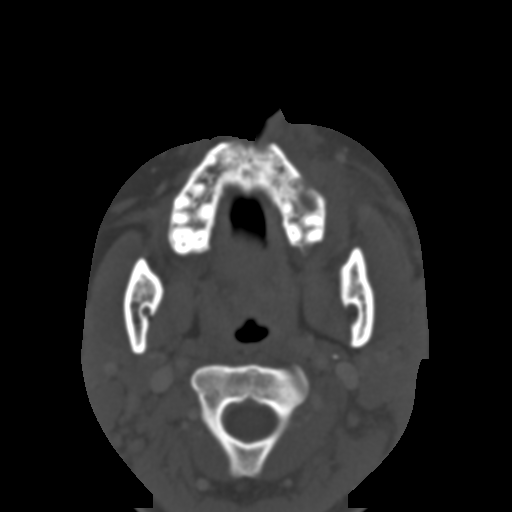
[im 51/78  bone]
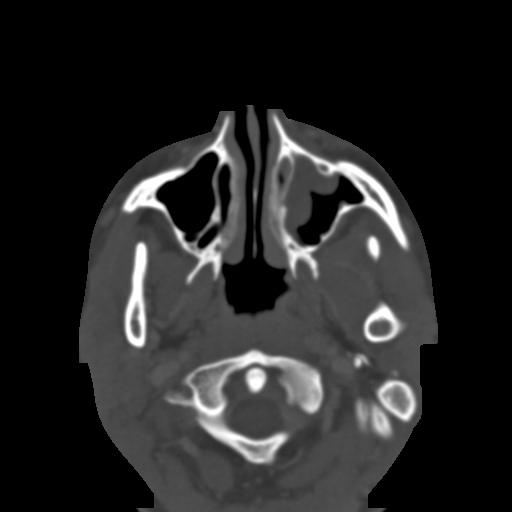
[im 59/78  bone]
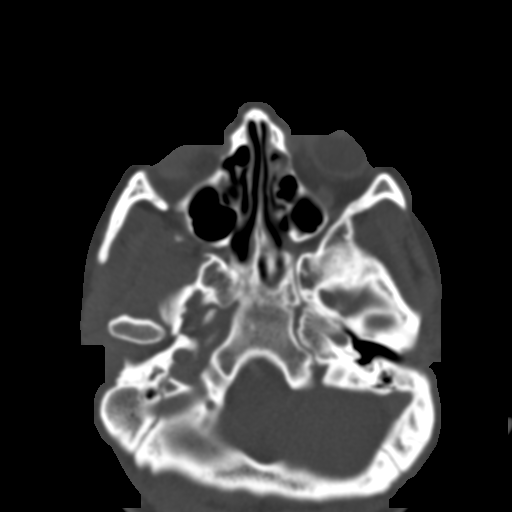
[im 64/78  brain]
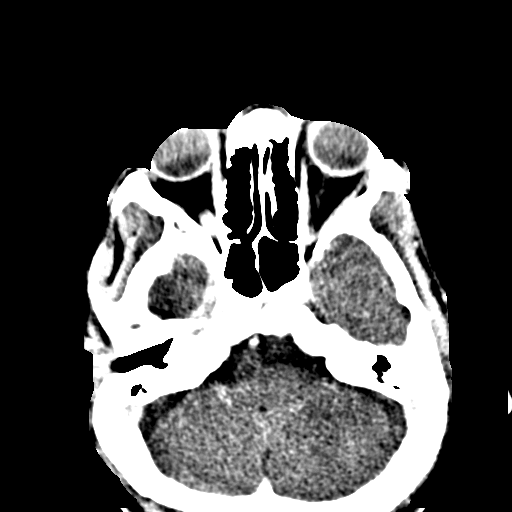
[im 64/78  bone]
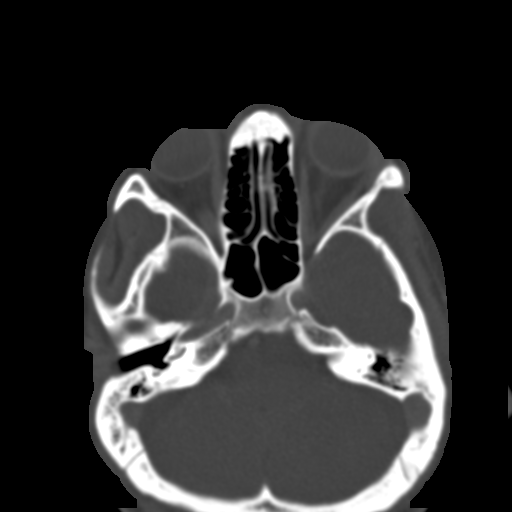
[im 72/78  bone]
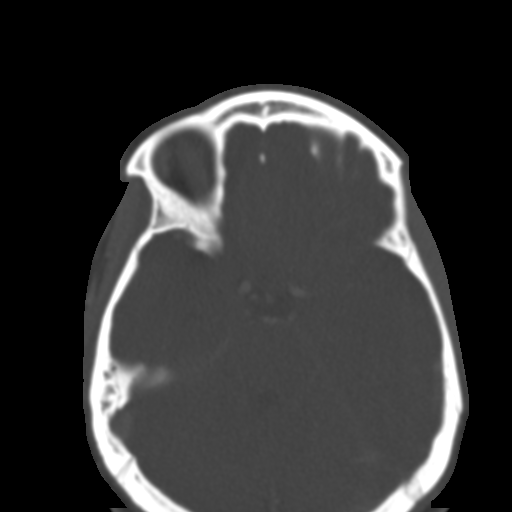

[Series 7: coronal soft tissue · coronal · 0.30mm/px · 3 of 75 slices shown]
[im 33/75  bone]
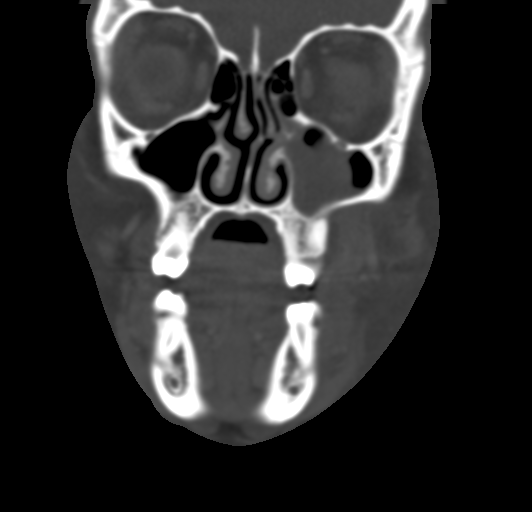
[im 42/75  bone]
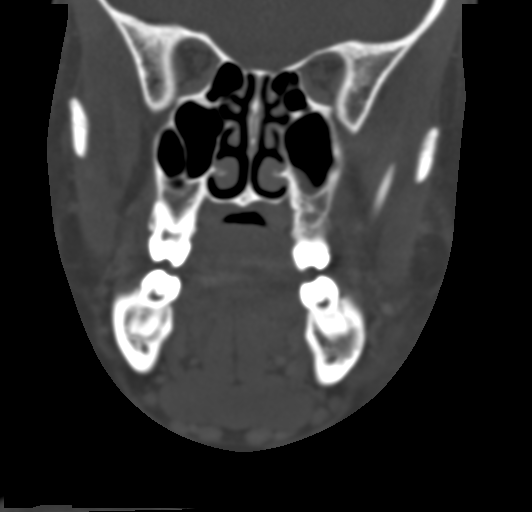
[im 50/75  bone]
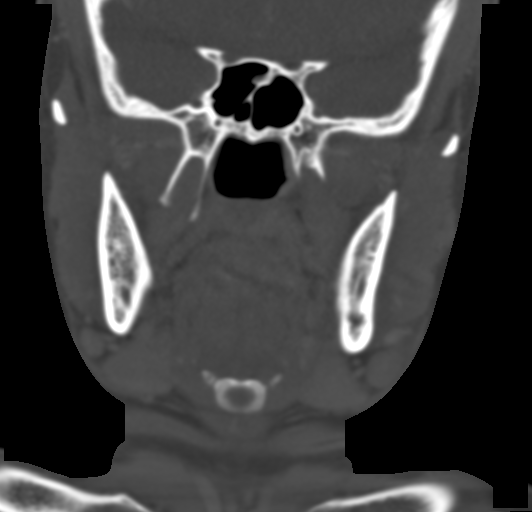

[Series 8: sagittal soft tissue · sagittal · 0.27mm/px · 3 of 76 slices shown]
[im 26/76  bone]
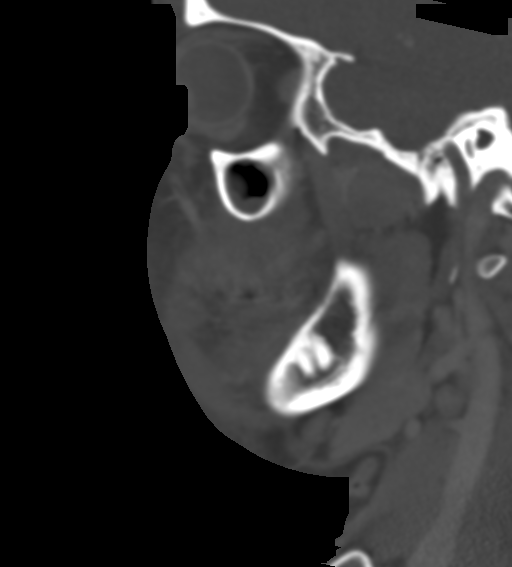
[im 38/76  bone]
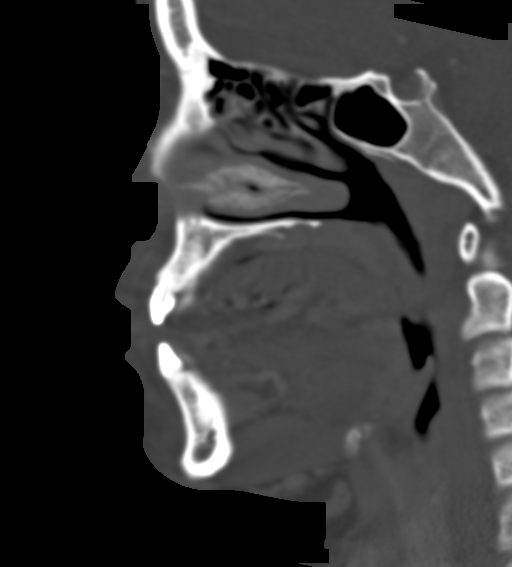
[im 51/76  bone]
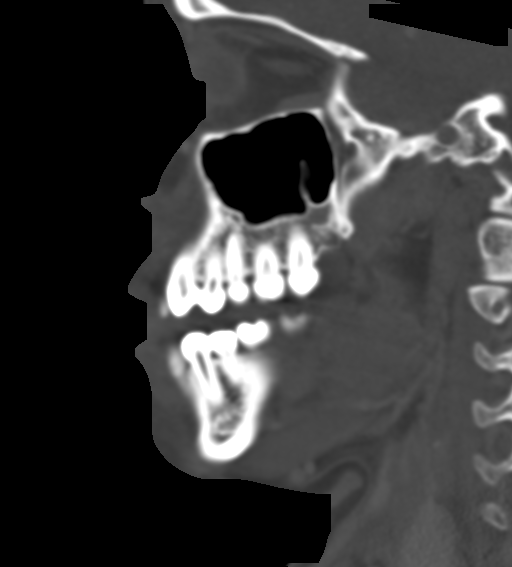

[16 of 47 positions shown; findings below may reference images not displayed]

FINDINGS: Cellulitic changes in the left face contiguous with subperiosteal
abscess along the buccal surface of the left alveolar process, which
measures 12 x 12 x 4 mm. The offending tooth is number 13, which is
devitalized with periapical erosion.

Osseous: No evidence of osteomyelitis or trauma.

Orbits: Negative.  No inflammation noted.

Sinuses: Mucosal thickening focally in the inferior left maxillary
sinus, likely odontogenic.

Soft tissues: Left face cellulitis as described above. Reactive
appearing cervical lymph node enlargement.

Limited intracranial: Negative
IMPRESSION: 1. Odontogenic infection in the left face related to tooth #13. A
subperiosteal abscess on the outer alveolar process measures 12 x 12
x 4 mm.
2. Sinusitis in the inferior left maxillary antrum, likely
odontogenic.

## 2018-10-08 ENCOUNTER — Emergency Department (HOSPITAL_COMMUNITY)
Admission: EM | Admit: 2018-10-08 | Discharge: 2018-10-08 | Disposition: A | Discharge status: Home / Self Care | Payer: Medicaid Other | Attending: Emergency Medicine | Admitting: Emergency Medicine

## 2018-10-08 ENCOUNTER — Encounter (HOSPITAL_COMMUNITY): Payer: Self-pay

## 2018-10-08 ENCOUNTER — Other Ambulatory Visit: Payer: Self-pay

## 2018-10-08 DIAGNOSIS — O99891 Other specified diseases and conditions complicating pregnancy: Secondary | ICD-10-CM | POA: Diagnosis present

## 2018-10-08 DIAGNOSIS — Z3201 Encounter for pregnancy test, result positive: Secondary | ICD-10-CM | POA: Diagnosis not present

## 2018-10-08 DIAGNOSIS — Z3A00 Weeks of gestation of pregnancy not specified: Secondary | ICD-10-CM | POA: Diagnosis not present

## 2018-10-08 LAB — POC URINE PREG, ED: Preg Test, Ur: POSITIVE — AB

## 2018-10-08 NOTE — Discharge Instructions (Signed)
Please return for any problem.  Follow-up with your regular care providers as instructed.  Please visit [email or web address] for addition information regarding obstetric services available at [facility] health.

## 2018-10-08 NOTE — ED Notes (Signed)
I called patient name in lobby for a room and no one responded

## 2018-10-08 NOTE — ED Provider Notes (Signed)
[facility] DEPT Provider Note   CSN: [id number] Arrival date & time: 10/08/18  1239     History   Chief Complaint Chief Complaint  Patient presents with  . Possible Pregnancy    HPI Meghan Warren is a 23 y.o. female.     23 year old female with prior medical history as detailed below presents for evaluation.  Patient reports that she had a positive pregnancy test over the weekend.  She request confirmation of a pregnancy.  She is otherwise without complaint.  She specifically denies vaginal bleeding, abdominal pain, fever, nausea, vomiting, or other acute complaint.  She is unsure of her last menstrual period.  She has not yet established OB care.  This would be her first pregnancy.  The history is provided by the patient and medical records.  Possible Pregnancy This is a new problem. The current episode started 2 days ago. The problem occurs rarely. The problem has not changed since onset.Pertinent negatives include no chest pain, no abdominal pain, no headaches and no shortness of breath. Nothing aggravates the symptoms. Nothing relieves the symptoms.    History reviewed. No pertinent past medical history.  There are no active problems to display for this patient.   History reviewed. No pertinent surgical history.   OB History   No obstetric history on file.      Home Medications    Prior to Admission medications   Medication Sig Start Date End Date Taking? Authorizing Provider  ondansetron (ZOFRAN ODT) 4 MG disintegrating tablet Take 1 tablet (4 mg total) by mouth every 8 (eight) hours as needed for nausea or vomiting. Patient not taking: Reported on 05/22/2016 02/27/15   Carlisle Cater, PA-C    Family History Family History  Problem Relation Age of Onset  . Hypertension Mother   . Healthy Father     Social History Social History   Tobacco Use  . Smoking status: Never Smoker  . Smokeless tobacco: Never Used  Substance  Use Topics  . Alcohol use: No  . Drug use: No     Allergies   Patient has no known allergies.   Review of Systems Review of Systems  Respiratory: Negative for shortness of breath.   Cardiovascular: Negative for chest pain.  Gastrointestinal: Negative for abdominal pain.  Neurological: Negative for headaches.  All other systems reviewed and are negative.    Physical Exam Updated Vital Signs BP 113/69 (BP Location: Left Arm)   Pulse 63   Temp 98.1 F (36.7 C) (Oral)   Resp 14   Ht 5\' 3"  (1.6 m)   Wt 68 kg   SpO2 100%   BMI 26.57 kg/m   Physical Exam Vitals signs and nursing note reviewed.  Constitutional:      General: She is not in acute distress.    Appearance: Normal appearance. She is well-developed.  HENT:     Head: Normocephalic and atraumatic.  Eyes:     Conjunctiva/sclera: Conjunctivae normal.     Pupils: Pupils are equal, round, and reactive to light.  Neck:     Musculoskeletal: Normal range of motion and neck supple.  Cardiovascular:     Rate and Rhythm: Normal rate and regular rhythm.     Heart sounds: Normal heart sounds.  Pulmonary:     Effort: Pulmonary effort is normal. No respiratory distress.     Breath sounds: Normal breath sounds.  Abdominal:     General: There is no distension.     Palpations: Abdomen  is soft.     Tenderness: There is no abdominal tenderness.  Musculoskeletal: Normal range of motion.        General: No deformity.  Skin:    General: Skin is warm and dry.  Neurological:     Mental Status: She is alert and oriented to person, place, and time.      ED Treatments / Results  Labs (all labs ordered are listed, but only abnormal results are displayed) Labs Reviewed  POC URINE PREG, ED - Abnormal; Notable for the following components:      Result Value   Preg Test, Ur POSITIVE (*)    All other components within normal limits    EKG None  Radiology No results found.  Procedures Procedures (including critical  care time)  Medications Ordered in ED Medications - No data to display   Initial Impression / Assessment and Plan / ED Course  I have reviewed the triage vital signs and the nursing notes.  Pertinent labs & imaging results that were available during my care of the patient were reviewed by me and considered in my medical decision making (see chart for details).        MDM  Screen complete  Meghan Warren was evaluated in Emergency Department on 10/08/2018 for the symptoms described in the history of present illness. She was evaluated in the context of the global COVID-19 pandemic, which necessitated consideration that the patient might be at risk for infection with the SARS-CoV-2 virus that causes COVID-19. Institutional protocols and algorithms that pertain to the evaluation of patients at risk for COVID-19 are in a state of rapid change based on information released by regulatory bodies including the CDC and federal and state organizations. These policies and algorithms were followed during the patient's care in the ED.  Patient is presenting for confirmation of a home pregnancy test.  Pregnancy test today is positive.  Her last menstrual period is unclear.  She is otherwise without specific complaint.  Patient is given instructions about how and where to obtain additional OB care.  Importance of close follow-up is repeatedly stressed.  Strict return precautions given and understood.  Final Clinical Impressions(s) / ED Diagnoses   Final diagnoses:  Positive pregnancy test    ED Discharge Orders    None       Wynetta Fines, MD 10/08/18 [phone]

## 2018-10-08 NOTE — ED Triage Notes (Signed)
Patient is requesting a pregnancy test. 

## 2018-10-31 ENCOUNTER — Inpatient Hospital Stay (HOSPITAL_COMMUNITY)
Admission: AD | Admit: 2018-10-31 | Discharge: 2018-10-31 | Disposition: A | Discharge status: Home / Self Care | Payer: Medicaid Other | Attending: Obstetrics and Gynecology | Admitting: Obstetrics and Gynecology

## 2018-10-31 ENCOUNTER — Encounter (HOSPITAL_COMMUNITY): Payer: Self-pay

## 2018-10-31 ENCOUNTER — Inpatient Hospital Stay (HOSPITAL_COMMUNITY): Payer: Medicaid Other

## 2018-10-31 ENCOUNTER — Other Ambulatory Visit: Payer: Self-pay

## 2018-10-31 DIAGNOSIS — Z3A12 12 weeks gestation of pregnancy: Secondary | ICD-10-CM | POA: Insufficient documentation

## 2018-10-31 DIAGNOSIS — Z8249 Family history of ischemic heart disease and other diseases of the circulatory system: Secondary | ICD-10-CM | POA: Diagnosis not present

## 2018-10-31 DIAGNOSIS — O4691 Antepartum hemorrhage, unspecified, first trimester: Secondary | ICD-10-CM

## 2018-10-31 DIAGNOSIS — N939 Abnormal uterine and vaginal bleeding, unspecified: Secondary | ICD-10-CM | POA: Diagnosis present

## 2018-10-31 DIAGNOSIS — O208 Other hemorrhage in early pregnancy: Secondary | ICD-10-CM | POA: Diagnosis not present

## 2018-10-31 DIAGNOSIS — Z3A11 11 weeks gestation of pregnancy: Secondary | ICD-10-CM

## 2018-10-31 DIAGNOSIS — O209 Hemorrhage in early pregnancy, unspecified: Secondary | ICD-10-CM

## 2018-10-31 DIAGNOSIS — O4690 Antepartum hemorrhage, unspecified, unspecified trimester: Secondary | ICD-10-CM

## 2018-10-31 LAB — CBC
HCT: 35.7 % — ABNORMAL LOW (ref 36.0–46.0)
Hemoglobin: 11.8 g/dL — ABNORMAL LOW (ref 12.0–15.0)
MCH: 28.1 pg (ref 26.0–34.0)
MCHC: 33.1 g/dL (ref 30.0–36.0)
MCV: 85 fL (ref 80.0–100.0)
Platelets: 302 10*3/uL (ref 150–400)
RBC: 4.2 MIL/uL (ref 3.87–5.11)
RDW: 12.3 % (ref 11.5–15.5)
WBC: 8.8 10*3/uL (ref 4.0–10.5)
nRBC: 0 % (ref 0.0–0.2)

## 2018-10-31 LAB — DIFFERENTIAL
Abs Immature Granulocytes: 0.03 10*3/uL (ref 0.00–0.07)
Basophils Absolute: 0.1 10*3/uL (ref 0.0–0.1)
Basophils Relative: 1 %
Eosinophils Absolute: 0.1 10*3/uL (ref 0.0–0.5)
Eosinophils Relative: 1 %
Immature Granulocytes: 0 %
Lymphocytes Relative: 22 %
Lymphs Abs: 2 10*3/uL (ref 0.7–4.0)
Monocytes Absolute: 0.5 10*3/uL (ref 0.1–1.0)
Monocytes Relative: 6 %
Neutro Abs: 6.1 10*3/uL (ref 1.7–7.7)
Neutrophils Relative %: 70 %

## 2018-10-31 LAB — WET PREP, GENITAL
Clue Cells Wet Prep HPF POC: NONE SEEN
Sperm: NONE SEEN
Trich, Wet Prep: NONE SEEN
Yeast Wet Prep HPF POC: NONE SEEN

## 2018-10-31 LAB — TYPE AND SCREEN
ABO/RH(D): O POS
Antibody Screen: NEGATIVE

## 2018-10-31 LAB — RAPID HIV SCREEN (HIV 1/2 AB+AG)
HIV 1/2 Antibodies: NONREACTIVE
HIV-1 P24 Antigen - HIV24: NONREACTIVE

## 2018-10-31 LAB — HCG, QUANTITATIVE, PREGNANCY: hCG, Beta Chain, Quant, S: 59070 m[IU]/mL — ABNORMAL HIGH (ref <5)

## 2018-10-31 LAB — HEPATITIS B SURFACE ANTIGEN: Hepatitis B Surface Ag: NONREACTIVE

## 2018-10-31 NOTE — Discharge Instructions (Signed)
Vaginal Bleeding During Pregnancy, First Trimester ° °A small amount of bleeding from the vagina (spotting) is relatively common during early pregnancy. It usually stops on its own. Various things may cause bleeding or spotting during early pregnancy. Some bleeding may be related to the pregnancy, and some may not. In many cases, the bleeding is normal and is not a problem. However, bleeding can also be a sign of something serious. Be sure to tell your health care provider about any vaginal bleeding right away. °Some possible causes of vaginal bleeding during the first trimester include: °· Infection or inflammation of the cervix. °· Growths (polyps) on the cervix. °· Miscarriage or threatened miscarriage. °· Pregnancy tissue developing outside of the uterus (ectopic pregnancy). °· A mass of tissue developing in the uterus due to an egg being fertilized incorrectly (molar pregnancy). °Follow these instructions at home: °Activity °· Follow instructions from your health care provider about limiting your activity. Ask what activities are safe for you. °· If needed, make plans for someone to help with your regular activities. °· Do not have sex or orgasms until your health care provider says that this is safe. °General instructions °· Take over-the-counter and prescription medicines only as told by your health care provider. °· Pay attention to any changes in your symptoms. °· Do not use tampons or douche. °· Write down how many pads you use each day, how often you change pads, and how soaked (saturated) they are. °· If you pass any tissue from your vagina, save the tissue so you can show it to your health care provider. °· Keep all follow-up visits as told by your health care provider. This is important. °Contact a health care provider if: °· You have vaginal bleeding during any part of your pregnancy. °· You have cramps or labor pains. °· You have a fever. °Get help right away if: °· You have severe cramps in your  back or abdomen. °· You pass large clots or a large amount of tissue from your vagina. °· Your bleeding increases. °· You feel light-headed or weak, or you faint. °· You have chills. °· You are leaking fluid or have a gush of fluid from your vagina. °Summary °· A small amount of bleeding (spotting) from the vagina is relatively common during early pregnancy. °· Various things may cause bleeding or spotting in early pregnancy. °· Be sure to tell your health care provider about any vaginal bleeding right away. °This information is not intended to replace advice given to you by your health care provider. Make sure you discuss any questions you have with your health care provider. °Document Released: 10/02/2004 Document Revised: 04/13/2018 Document Reviewed: 03/27/2016 °Elsevier Patient Education © 2020 Elsevier Inc. ° °

## 2018-10-31 NOTE — MAU Note (Addendum)
Pt here for vaginal bleeding after she went to the bathroom at work around 4-4:30pm. States it was heavy when she wiped, but is now spotting. Had intercourse this morning. Pt denies pain. LMP: around August 7th.

## 2018-10-31 NOTE — MAU Provider Note (Signed)
History     CSN: [id number]  Arrival date and time: 10/31/18 6295   First Provider Initiated Contact with Patient 10/31/18 2006      Chief Complaint  Patient presents with  . Vaginal Bleeding   Meghan Warren is a 23 yo G1P0 at 11.2 EGA by LMP who is presenting for new onset vaginal bleeding. She says it started after having intercourse this afternoon. She had an episode of heavy bleeding with 2-3 small clots. She says it soaked a pad, but now has decreased to light spotting. She denies cramping, abdominal pain, nausea, or vomiting.    OB History    Gravida  1   Para      Term      Preterm      AB      Living        SAB      TAB      Ectopic      Multiple      Live Births              History reviewed. No pertinent past medical history.  History reviewed. No pertinent surgical history.  Family History  Problem Relation Age of Onset  . Hypertension Mother   . Healthy Father     Social History   Tobacco Use  . Smoking status: Never Smoker  . Smokeless tobacco: Never Used  Substance Use Topics  . Alcohol use: No  . Drug use: No    Allergies: No Known Allergies  Medications Prior to Admission  Medication Sig Dispense Refill Last Dose  . ondansetron (ZOFRAN ODT) 4 MG disintegrating tablet Take 1 tablet (4 mg total) by mouth every 8 (eight) hours as needed for nausea or vomiting. (Patient not taking: Reported on 05/22/2016) 10 tablet 0     Review of Systems  All other systems reviewed and are negative.  Physical Exam   Blood pressure 115/68, pulse 64, temperature 98.4 F (36.9 C), temperature source Oral, resp. rate 18, height 5' 4.5" (1.638 m), weight 68 kg, last menstrual period 08/13/2018, SpO2 100 %.  Physical Exam  Nursing note and vitals reviewed. Constitutional: She is oriented to person, place, and time. She appears well-developed and well-nourished.  HENT:  Head: Normocephalic and atraumatic.  Eyes: Pupils are equal, round, and  reactive to light. Conjunctivae and EOM are normal.  Neck: Normal range of motion. Neck supple.  Cardiovascular: Normal rate, regular rhythm and normal heart sounds.  Respiratory: Effort normal and breath sounds normal.  GI: Soft. Bowel sounds are normal. She exhibits no distension and no mass. There is no abdominal tenderness. There is no rebound and no guarding.  Genitourinary:    Vagina and uterus normal.     Genitourinary Comments: Small amount of dark red blood in the vaginal vault. Nulliparous Os which is closed. No CMT on bi-manual. Uterus size c/w 11 week pregnancy.   Musculoskeletal: Normal range of motion.  Neurological: She is alert and oriented to person, place, and time. She has normal reflexes.  Skin: Skin is warm and dry.  Psychiatric: She has a normal mood and affect. Her behavior is normal. Judgment and thought content normal.    MAU Course  Procedures  MDM -prenatal labs -Wet prep and GC/CT swabs collected -transvaginal US ordered  Results for orders placed or performed during the hospital encounter of 10/31/18 (from the past 24 hour(s))  Wet prep, genital     Status: Abnormal   Collection Time: 10/31/18  8:17  PM   Specimen: Vaginal  Result Value Ref Range   Yeast Wet Prep HPF POC NONE SEEN NONE SEEN   Trich, Wet Prep NONE SEEN NONE SEEN   Clue Cells Wet Prep HPF POC NONE SEEN NONE SEEN   WBC, Wet Prep HPF POC FEW (A) NONE SEEN   Sperm NONE SEEN   Hepatitis B surface antigen     Status: None   Collection Time: 10/31/18  8:49 PM  Result Value Ref Range   Hepatitis B Surface Ag NON REACTIVE NON REACTIVE  CBC     Status: Abnormal   Collection Time: 10/31/18  8:49 PM  Result Value Ref Range   WBC 8.8 4.0 - 10.5 K/uL   RBC 4.20 3.87 - 5.11 MIL/uL   Hemoglobin 11.8 (L) 12.0 - 15.0 g/dL   HCT 35.7 (L) 36.0 - 46.0 %   MCV 85.0 80.0 - 100.0 fL   MCH 28.1 26.0 - 34.0 pg   MCHC 33.1 30.0 - 36.0 g/dL   RDW 12.3 11.5 - 15.5 %   Platelets 302 150 - 400 K/uL   nRBC  0.0 0.0 - 0.2 %  Differential     Status: None   Collection Time: 10/31/18  8:49 PM  Result Value Ref Range   Neutrophils Relative % 70 %   Neutro Abs 6.1 1.7 - 7.7 K/uL   Lymphocytes Relative 22 %   Lymphs Abs 2.0 0.7 - 4.0 K/uL   Monocytes Relative 6 %   Monocytes Absolute 0.5 0.1 - 1.0 K/uL   Eosinophils Relative 1 %   Eosinophils Absolute 0.1 0.0 - 0.5 K/uL   Basophils Relative 1 %   Basophils Absolute 0.1 0.0 - 0.1 K/uL   Immature Granulocytes 0 %   Abs Immature Granulocytes 0.03 0.00 - 0.07 K/uL  Rapid HIV screen (HIV 1/2 Ab+Ag)     Status: None   Collection Time: 10/31/18  8:49 PM  Result Value Ref Range   HIV-1 P24 Antigen - HIV24 NON REACTIVE NON REACTIVE   HIV 1/2 Antibodies NON REACTIVE NON REACTIVE   Interpretation (HIV Ag Ab)      A non reactive test result means that HIV 1 or HIV 2 antibodies and HIV 1 p24 antigen were not detected in the specimen.  Type and screen [place]     Status: None   Collection Time: 10/31/18  8:49 PM  Result Value Ref Range   ABO/RH(D) O POS    Antibody Screen NEG    Sample Expiration      11/03/2018,2359 Performed at [facility], [place] [address], [place], [place] [address]   hCG, quantitative, pregnancy     Status: Abnormal   Collection Time: 10/31/18  8:49 PM  Result Value Ref Range   hCG, Beta Chain, Quant, S 59,070 (H) <5 mIU/mL  ABO/Rh     Status: None (Preliminary result)   Collection Time: 10/31/18  8:49 PM  Result Value Ref Range   ABO/RH(D)      O POS Performed at [facility] [address], [place],  [address]    US Ob Comp Less 14 Wks  Result Date: 10/31/2018 CLINICAL DATA:  23 year old pregnant female with postcoital bleeding. LMP: 08/13/2018 corresponding to an estimated gestational age of [redacted] weeks, 6 days. EXAM: OBSTETRIC <14 WK ULTRASOUND TECHNIQUE: Transabdominal ultrasound was performed for evaluation of the gestation as well as the maternal uterus and adnexal regions.  COMPARISON:  None. FINDINGS: Intrauterine gestational  sac: Single intrauterine gestational sac. Yolk sac:  Seen Embryo:  Present Cardiac Activity: Detected Heart Rate: 138 bpm CRL: 55 mm   12 w 0 d                  US EDC: 05/15/2019 Subchorionic hemorrhage: There is a 2.4 x 0.8 x 1.6 cm subchorionic hemorrhage. Maternal uterus/adnexae: The maternal ovaries are unremarkable. No free fluid within the pelvis. IMPRESSION: Single live intrauterine pregnancy with an estimated gestational age of [redacted] weeks, 0 days based on today's ultrasound. Electronically Signed   By: Elgie CollardArash  Radparvar M.D.   On: 10/31/2018 22:02   Recheck at 10:45 -patient says bleeding has stopped -no cramping, nausea, vomiting Assessment and Plan  23 yo G1P0 at [redacted] weeks EGA by first trimester US -dc to home -educated on first trimester bleeding -return precautions given -to establish at the health department on 11/12/18  Marlowe AltSparacino, Denys Labree L, DO OB Fellow, Faculty Practice 10/31/2018 10:59 PM

## 2018-11-01 LAB — RPR: RPR Ser Ql: NONREACTIVE

## 2018-11-01 LAB — ABO/RH: ABO/RH(D): O POS

## 2018-11-02 LAB — GC/CHLAMYDIA PROBE AMP (~~LOC~~) NOT AT ARMC
Chlamydia: POSITIVE — AB
Comment: NEGATIVE
Comment: NORMAL
Neisseria Gonorrhea: NEGATIVE

## 2018-11-02 LAB — RUBELLA SCREEN: Rubella: 5.83 index (ref >0.99)

## 2018-11-03 ENCOUNTER — Telehealth: Payer: Self-pay | Admitting: Student

## 2018-11-03 ENCOUNTER — Other Ambulatory Visit: Payer: Self-pay | Admitting: Student

## 2018-11-03 DIAGNOSIS — A749 Chlamydial infection, unspecified: Secondary | ICD-10-CM | POA: Insufficient documentation

## 2018-11-03 MED ORDER — AZITHROMYCIN 250 MG PO TABS: 1000.0000 mg | ORAL_TABLET | Freq: Once | ORAL | 0 refills | Status: AC

## 2018-11-03 NOTE — Telephone Encounter (Signed)
Patient notified of positive chlamydia results. Patient will pick up RX at pharmacy and notify partners, who can get treated at [facility].  Recommendations given on how to avoid re-infection until after partner has been treated . Patient had no further questions.

## 2019-01-07 NOTE — L&D Delivery Note (Addendum)
OB/GYN Faculty Practice Delivery Note  Meghan Warren is a 24 y.o. G1P1001 s/p NSVD at [redacted]w[redacted]d. She was admitted for active labor.   ROM: 0h 1m with clear fluid GBS Status: positive, did not receive adequate prophylaxis Maximum Maternal Temperature: 98.4*F  Labor Progress: . Admitted at 5/80/-1. Quickly progressed without augmentation to complete, delivering shortly thereafter  Delivery Date/Time: 05/07/19, 2343 Delivery: Called to room and patient was complete and pushing. Head delivered ROA. No nuchal cord present. Shoulder and body delivered in usual fashion. Infant with spontaneous cry, placed on mother's abdomen, dried and stimulated. Cord clamped x 2 after 1-minute delay, and cut by aunt of baby under my direct supervision. Cord blood drawn. Placenta delivered spontaneously with gentle cord traction. Fundus firm with massage and Pitocin. Labia, perineum, vagina, and cervix were inspected; bilateral periurethral, bilateral labia minor, and a left vaginal side wall tear were noted- all repaired with Monocryl in the usual fashion.   Placenta: 3 vessel cord, intact, to L&D Complications: Precipitous labor Lacerations: bilateral periurethral, bilateral labia minor, and a left vaginal side wall tear were noted- all repaired with Monocryl in the usual fashion EBL: 212 mL Analgesia: 1% lidocaine for vaginal repairs  Postpartum Planning [x]  message to sent to schedule follow-up  [x]  vaccines UTD  Infant: female  APGARs 50, 9  2671 g  8, DO OB/GYN Fellow, Faculty Practice

## 2019-01-12 ENCOUNTER — Other Ambulatory Visit: Payer: Self-pay

## 2019-01-12 DIAGNOSIS — Z20822 Contact with and (suspected) exposure to covid-19: Secondary | ICD-10-CM

## 2019-01-13 LAB — NOVEL CORONAVIRUS, NAA: SARS-CoV-2, NAA: DETECTED — AB

## 2019-01-14 ENCOUNTER — Encounter: Payer: Self-pay | Admitting: Medical

## 2019-01-14 DIAGNOSIS — U071 COVID-19: Secondary | ICD-10-CM | POA: Insufficient documentation

## 2019-01-15 ENCOUNTER — Telehealth: Payer: Self-pay | Admitting: Adult Health

## 2019-01-15 NOTE — Telephone Encounter (Signed)
Called patient and LMOM that I called to review their test results and to see how they are feeling.  Will try patient back later this morning.    Naureen Benton, NP  

## 2019-05-06 ENCOUNTER — Inpatient Hospital Stay (HOSPITAL_COMMUNITY)
Admission: AD | Admit: 2019-05-06 | Discharge: 2019-05-06 | Disposition: A | Discharge status: Home / Self Care | Payer: Medicaid Other | Source: Ambulatory Visit | Attending: Obstetrics & Gynecology | Admitting: Obstetrics & Gynecology

## 2019-05-06 ENCOUNTER — Other Ambulatory Visit: Payer: Self-pay

## 2019-05-06 ENCOUNTER — Encounter (HOSPITAL_COMMUNITY): Payer: Self-pay | Admitting: Obstetrics & Gynecology

## 2019-05-06 DIAGNOSIS — Z3A38 38 weeks gestation of pregnancy: Secondary | ICD-10-CM

## 2019-05-06 DIAGNOSIS — O471 False labor at or after 37 completed weeks of gestation: Secondary | ICD-10-CM | POA: Diagnosis not present

## 2019-05-06 NOTE — MAU Provider Note (Signed)
Ms. Meghan Warren is a G1P0000 at [redacted]w[redacted]d seen in MAU for labor. RN labor check, not seen by provider. SVE by RN Dilation: 1.5 Effacement (%): Thick Station: -2 Presentation: Vertex Exam by:: Leafy Ro, RN   NST - FHR: 130 bpm / moderate variability / accels present / decels absent / TOCO: irregular every 3-10 mins   Plan:  D/C home with labor precautions Keep scheduled appt with [facility]  Raelyn Mora, CNM  05/06/2019 1:56 PM

## 2019-05-06 NOTE — MAU Note (Signed)
Thinks she is having contractions. Started last night.  Stomach gets real, real tight and then it hurts in her lower abd.  Started spotting this morning.  Pain comes every 5 min.

## 2019-05-07 ENCOUNTER — Inpatient Hospital Stay (HOSPITAL_COMMUNITY)
Admission: AD | Admit: 2019-05-07 | Discharge: 2019-05-09 | DRG: 806 | Disposition: A | Discharge status: Home / Self Care | Payer: Medicaid Other | Attending: Obstetrics and Gynecology | Admitting: Obstetrics and Gynecology

## 2019-05-07 ENCOUNTER — Encounter (HOSPITAL_COMMUNITY): Payer: Self-pay | Admitting: Obstetrics and Gynecology

## 2019-05-07 ENCOUNTER — Other Ambulatory Visit: Payer: Self-pay

## 2019-05-07 DIAGNOSIS — O99824 Streptococcus B carrier state complicating childbirth: Secondary | ICD-10-CM | POA: Diagnosis present

## 2019-05-07 DIAGNOSIS — O99334 Smoking (tobacco) complicating childbirth: Secondary | ICD-10-CM | POA: Diagnosis present

## 2019-05-07 DIAGNOSIS — O26893 Other specified pregnancy related conditions, third trimester: Secondary | ICD-10-CM | POA: Diagnosis present

## 2019-05-07 DIAGNOSIS — Z8619 Personal history of other infectious and parasitic diseases: Secondary | ICD-10-CM | POA: Diagnosis not present

## 2019-05-07 DIAGNOSIS — O99333 Smoking (tobacco) complicating pregnancy, third trimester: Secondary | ICD-10-CM | POA: Diagnosis not present

## 2019-05-07 DIAGNOSIS — F1290 Cannabis use, unspecified, uncomplicated: Secondary | ICD-10-CM | POA: Diagnosis present

## 2019-05-07 DIAGNOSIS — F1210 Cannabis abuse, uncomplicated: Secondary | ICD-10-CM | POA: Diagnosis not present

## 2019-05-07 DIAGNOSIS — F17200 Nicotine dependence, unspecified, uncomplicated: Secondary | ICD-10-CM | POA: Diagnosis present

## 2019-05-07 DIAGNOSIS — Z3A38 38 weeks gestation of pregnancy: Secondary | ICD-10-CM | POA: Diagnosis not present

## 2019-05-07 DIAGNOSIS — Z20822 Contact with and (suspected) exposure to covid-19: Secondary | ICD-10-CM | POA: Diagnosis present

## 2019-05-07 DIAGNOSIS — O99323 Drug use complicating pregnancy, third trimester: Secondary | ICD-10-CM | POA: Diagnosis not present

## 2019-05-07 DIAGNOSIS — O99324 Drug use complicating childbirth: Secondary | ICD-10-CM | POA: Diagnosis present

## 2019-05-07 HISTORY — DX: Other specified health status: Z78.9

## 2019-05-07 MED ORDER — PHENYLEPHRINE 40 MCG/ML (10ML) SYRINGE FOR IV PUSH (FOR BLOOD PRESSURE SUPPORT)
80.0000 ug | PREFILLED_SYRINGE | INTRAVENOUS | Status: DC | PRN
  Filled 2019-05-07: qty 10

## 2019-05-07 MED ORDER — OXYTOCIN BOLUS FROM INFUSION
500.0000 mL | Freq: Once | INTRAVENOUS | Status: AC
  Administered 2019-05-07: 500 mL via INTRAVENOUS

## 2019-05-07 MED ORDER — OXYTOCIN 40 UNITS IN NORMAL SALINE INFUSION - SIMPLE MED
2.5000 [IU]/h | INTRAVENOUS | Status: DC
  Filled 2019-05-07: qty 1000

## 2019-05-07 MED ORDER — ACETAMINOPHEN 325 MG PO TABS: 650.0000 mg | ORAL_TABLET | ORAL | Status: DC | PRN

## 2019-05-07 MED ORDER — LACTATED RINGERS IV SOLN: 500.0000 mL | INTRAVENOUS | Status: DC | PRN

## 2019-05-07 MED ORDER — FENTANYL CITRATE (PF) 100 MCG/2ML IJ SOLN
INTRAMUSCULAR | Status: AC
  Filled 2019-05-07: qty 2

## 2019-05-07 MED ORDER — FENTANYL-BUPIVACAINE-NACL 0.5-0.125-0.9 MG/250ML-% EP SOLN
12.0000 mL/h | EPIDURAL | Status: DC | PRN
  Filled 2019-05-07: qty 250

## 2019-05-07 MED ORDER — SODIUM CHLORIDE 0.9 % IV SOLN: 5.0000 10*6.[IU] | Freq: Once | INTRAVENOUS | Status: DC

## 2019-05-07 MED ORDER — LIDOCAINE HCL (PF) 1 % IJ SOLN
30.0000 mL | INTRAMUSCULAR | Status: AC | PRN
  Administered 2019-05-07: 30 mL via SUBCUTANEOUS
  Filled 2019-05-07: qty 30

## 2019-05-07 MED ORDER — EPHEDRINE 5 MG/ML INJ: 10.0000 mg | INTRAVENOUS | Status: DC | PRN

## 2019-05-07 MED ORDER — PHENYLEPHRINE 40 MCG/ML (10ML) SYRINGE FOR IV PUSH (FOR BLOOD PRESSURE SUPPORT): 80.0000 ug | PREFILLED_SYRINGE | INTRAVENOUS | Status: DC | PRN

## 2019-05-07 MED ORDER — SODIUM CHLORIDE 0.9 % IV SOLN
2.0000 g | Freq: Once | INTRAVENOUS | Status: AC
  Administered 2019-05-07: 2 g via INTRAVENOUS
  Filled 2019-05-07: qty 2000

## 2019-05-07 MED ORDER — ONDANSETRON HCL 4 MG/2ML IJ SOLN: 4.0000 mg | Freq: Four times a day (QID) | INTRAMUSCULAR | Status: DC | PRN

## 2019-05-07 MED ORDER — LACTATED RINGERS IV SOLN: 500.0000 mL | Freq: Once | INTRAVENOUS | Status: DC

## 2019-05-07 MED ORDER — SOD CITRATE-CITRIC ACID 500-334 MG/5ML PO SOLN: 30.0000 mL | ORAL | Status: DC | PRN

## 2019-05-07 MED ORDER — LACTATED RINGERS IV SOLN: INTRAVENOUS | Status: DC

## 2019-05-07 MED ORDER — DIPHENHYDRAMINE HCL 50 MG/ML IJ SOLN: 12.5000 mg | INTRAMUSCULAR | Status: DC | PRN

## 2019-05-07 MED ORDER — PENICILLIN G POT IN DEXTROSE 60000 UNIT/ML IV SOLN
3.0000 10*6.[IU] | INTRAVENOUS | Status: DC
Start: 2019-05-08 — End: 2019-05-07

## 2019-05-07 MED ORDER — FENTANYL CITRATE (PF) 100 MCG/2ML IJ SOLN
50.0000 ug | INTRAMUSCULAR | Status: DC | PRN
  Administered 2019-05-07: 100 ug via INTRAVENOUS

## 2019-05-07 NOTE — H&P (Signed)
OBSTETRIC ADMISSION HISTORY AND PHYSICAL  TEONA VARGUS is a 24 y.o. female G1P0000 with IUP at [redacted]w[redacted]d presenting for SOL, with contractions starting yesterday. She reports +FMs. No LOF, VB, blurry vision, headaches, peripheral edema, or RUQ pain. She plans on breast and bottle feeding. She requests Depo for birth control.  Dating: By first trimester [place] --->  Estimated Date of Delivery: 05/20/19  Sono:  unavailable  Prenatal History/Complications: Chlamydia- TOC negative 01/31/19 UDS +: marijuana First trimester bleeding Tobacco use  Past Medical History: History reviewed. No pertinent past medical history.  Past Surgical History: Past Surgical History:  Procedure Laterality Date  . NO PAST SURGERIES      Obstetrical History: OB History    Gravida  1   Para  0   Term  0   Preterm  0   AB  0   Living  0     SAB  0   TAB  0   Ectopic  0   Multiple  0   Live Births  0           Social History: Social History   Socioeconomic History  . Marital status: Single    Spouse name: Not on file  . Number of children: Not on file  . Years of education: Not on file  . Highest education level: Not on file  Occupational History  . Not on file  Tobacco Use  . Smoking status: Current Every Day Smoker    Packs/day: 0.00  . Smokeless tobacco: Never Used  . Tobacco comment: 1-2 blacks/day  Substance and Sexual Activity  . Alcohol use: No  . Drug use: Yes    Types: Marijuana    Comment: last used 05/07/19  . Sexual activity: Yes    Birth control/protection: None  Other Topics Concern  . Not on file  Social History Narrative  . Not on file   Social Determinants of Health   Financial Resource Strain:   . Difficulty of Paying Living Expenses:   Food Insecurity:   . Worried About Programme researcher, broadcasting/film/video in the Last Year:   . Barista in the Last Year:   Transportation Needs:   . Freight forwarder (Medical):   Marland Kitchen Lack of Transportation  (Non-Medical):   Physical Activity:   . Days of Exercise per Week:   . Minutes of Exercise per Session:   Stress:   . Feeling of Stress :   Social Connections:   . Frequency of Communication with Friends and Family:   . Frequency of Social Gatherings with Friends and Family:   . Attends Religious Services:   . Active Member of Clubs or Organizations:   . Attends Banker Meetings:   Marland Kitchen Marital Status:     Family History: Family History  Problem Relation Age of Onset  . Hypertension Mother   . Healthy Father     Allergies: No Known Allergies  Medications Prior to Admission  Medication Sig Dispense Refill Last Dose  . Prenatal Vit-Fe Fumarate-FA (PRENATAL MULTIVITAMIN) TABS tablet Take 1 tablet by mouth daily at 12 noon.   05/07/2019 at Unknown time     Review of Systems:  All systems reviewed and negative except as stated in HPI  PE: Blood pressure (!) 144/83, pulse 72, temperature 98.4 F (36.9 C), temperature source Oral, resp. rate 19, weight 86.6 kg, last menstrual period 08/13/2018. General appearance: alert and cooperative, breathing through contractions Lungs: regular rate and effort  Heart: regular rate  Abdomen: soft, non-tender Extremities: Homans sign is negative, no sign of DVT Presentation: cephalic EFM: 076 bpm, moderate variability, 15x15 accels, no decels Toco: CTX q1-3 minutes Dilation: 5 Effacement (%): 90 Station: -2 Exam by:: Patricia Pesa  RN  Prenatal labs: ABO, Rh: --/--/O POS, O POS Performed at [facility], [place] [address], [place], [place] [address]  [id number] 2049) Antibody: NEG (10/25 2049) Rubella: 5.83 (10/25 2049) RPR: NON REACTIVE (10/25 2049)  HBsAg: NON REACTIVE (10/25 2049)  HIV: NON REACTIVE (10/25 2049)  GBS:   positive 1 hr GTT negative  Prenatal Transfer Tool  Maternal Diabetes: No Genetic Screening: Normal Maternal Ultrasounds/Referrals: Normal Fetal Ultrasounds or other Referrals:  None Maternal  Substance Abuse:  Yes:  Type: Smoker, Marijuana Significant Maternal Medications:  None Significant Maternal Lab Results: Group B Strep positive  No results found for this or any previous visit (from the past 24 hour(s)).  Patient Active Problem List   Diagnosis Date Noted  . Normal labor 05/07/2019  . COVID-19 01/14/2019  . Chlamydia infection affecting pregnancy in first trimester 11/03/2018    Assessment: RONA TOMSON is a 24 y.o. G1P0000 at [redacted]w[redacted]d here for SOL  1. Labor: expectant management 2. FWB:  3. Pain: per patient request 4. GBS: positive, AMP ordered, then PCN if not delivered    Plan: Admit to L&D, anticipate NSVD  Trooper Olander L Annabeth Tortora, DO  05/07/2019, 11:30 PM

## 2019-05-07 NOTE — MAU Note (Signed)
Pt reports to MAU c/o ctx every 5-10 min. Pt states the pain is a 10/10. Pt plans a natural delivery. No bleeding or LOF. +FM.

## 2019-05-08 ENCOUNTER — Encounter (HOSPITAL_COMMUNITY): Payer: Self-pay | Admitting: Obstetrics and Gynecology

## 2019-05-08 DIAGNOSIS — Z3A38 38 weeks gestation of pregnancy: Secondary | ICD-10-CM

## 2019-05-08 DIAGNOSIS — O99824 Streptococcus B carrier state complicating childbirth: Secondary | ICD-10-CM

## 2019-05-08 DIAGNOSIS — F1210 Cannabis abuse, uncomplicated: Secondary | ICD-10-CM

## 2019-05-08 DIAGNOSIS — O99333 Smoking (tobacco) complicating pregnancy, third trimester: Secondary | ICD-10-CM

## 2019-05-08 DIAGNOSIS — O99323 Drug use complicating pregnancy, third trimester: Secondary | ICD-10-CM

## 2019-05-08 DIAGNOSIS — Z8619 Personal history of other infectious and parasitic diseases: Secondary | ICD-10-CM

## 2019-05-08 LAB — CBC
HCT: 38.5 % (ref 36.0–46.0)
HCT: 36.1 % (ref 36.0–46.0)
Hemoglobin: 12.3 g/dL (ref 12.0–15.0)
Hemoglobin: 11.6 g/dL — ABNORMAL LOW (ref 12.0–15.0)
MCH: 27.6 pg (ref 26.0–34.0)
MCH: 28 pg (ref 26.0–34.0)
MCHC: 31.9 g/dL (ref 30.0–36.0)
MCHC: 32.1 g/dL (ref 30.0–36.0)
MCV: 86.3 fL (ref 80.0–100.0)
MCV: 87.2 fL (ref 80.0–100.0)
Platelets: 322 10*3/uL (ref 150–400)
Platelets: 285 10*3/uL (ref 150–400)
RBC: 4.46 MIL/uL (ref 3.87–5.11)
RBC: 4.14 MIL/uL (ref 3.87–5.11)
RDW: 13.4 % (ref 11.5–15.5)
RDW: 13.5 % (ref 11.5–15.5)
WBC: 14.8 10*3/uL — ABNORMAL HIGH (ref 4.0–10.5)
WBC: 19.8 10*3/uL — ABNORMAL HIGH (ref 4.0–10.5)
nRBC: 0 % (ref 0.0–0.2)
nRBC: 0 % (ref 0.0–0.2)

## 2019-05-08 LAB — RESPIRATORY PANEL BY RT PCR (FLU A&B, COVID)
Influenza A by PCR: NEGATIVE
Influenza B by PCR: NEGATIVE
SARS Coronavirus 2 by RT PCR: NEGATIVE

## 2019-05-08 LAB — TYPE AND SCREEN
ABO/RH(D): O POS
Antibody Screen: NEGATIVE

## 2019-05-08 LAB — RPR: RPR Ser Ql: NONREACTIVE

## 2019-05-08 MED ORDER — TETANUS-DIPHTH-ACELL PERTUSSIS 5-2.5-18.5 LF-MCG/0.5 IM SUSP: 0.5000 mL | Freq: Once | INTRAMUSCULAR | Status: DC

## 2019-05-08 MED ORDER — SENNOSIDES-DOCUSATE SODIUM 8.6-50 MG PO TABS
2.0000 | ORAL_TABLET | ORAL | Status: DC
  Administered 2019-05-09: 2 via ORAL

## 2019-05-08 MED ORDER — PRENATAL MULTIVITAMIN CH
1.0000 | ORAL_TABLET | Freq: Every day | ORAL | Status: DC
  Administered 2019-05-08 – 2019-05-09 (×2): 1 via ORAL
  Filled 2019-05-08 (×2): qty 1

## 2019-05-08 MED ORDER — IBUPROFEN 600 MG PO TABS
600.0000 mg | ORAL_TABLET | Freq: Four times a day (QID) | ORAL | Status: DC
  Administered 2019-05-08 – 2019-05-09 (×6): 600 mg via ORAL
  Filled 2019-05-08 (×5): qty 1

## 2019-05-08 MED ORDER — SIMETHICONE 80 MG PO CHEW: 80.0000 mg | CHEWABLE_TABLET | ORAL | Status: DC | PRN

## 2019-05-08 MED ORDER — WITCH HAZEL-GLYCERIN EX PADS: 1.0000 "application " | MEDICATED_PAD | CUTANEOUS | Status: DC | PRN

## 2019-05-08 MED ORDER — ZOLPIDEM TARTRATE 5 MG PO TABS: 5.0000 mg | ORAL_TABLET | Freq: Every evening | ORAL | Status: DC | PRN

## 2019-05-08 MED ORDER — DIPHENHYDRAMINE HCL 25 MG PO CAPS: 25.0000 mg | ORAL_CAPSULE | Freq: Four times a day (QID) | ORAL | Status: DC | PRN

## 2019-05-08 MED ORDER — ACETAMINOPHEN 325 MG PO TABS
650.0000 mg | ORAL_TABLET | ORAL | Status: DC | PRN
  Administered 2019-05-08 (×2): 650 mg via ORAL
  Filled 2019-05-08 (×2): qty 2

## 2019-05-08 MED ORDER — LIDOCAINE 1% INJECTION FOR CIRCUMCISION
0.8000 mL | INJECTION | Freq: Once | INTRAVENOUS | Status: DC
  Filled 2019-05-08: qty 1

## 2019-05-08 MED ORDER — ONDANSETRON HCL 4 MG PO TABS: 4.0000 mg | ORAL_TABLET | ORAL | Status: DC | PRN

## 2019-05-08 MED ORDER — EPINEPHRINE TOPICAL FOR CIRCUMCISION 0.1 MG/ML: 1.0000 [drp] | TOPICAL | Status: DC | PRN

## 2019-05-08 MED ORDER — MEDROXYPROGESTERONE ACETATE 150 MG/ML IM SUSP
150.0000 mg | Freq: Once | INTRAMUSCULAR | Status: AC
  Administered 2019-05-09: 150 mg via INTRAMUSCULAR
  Filled 2019-05-08: qty 1

## 2019-05-08 MED ORDER — ACETAMINOPHEN FOR CIRCUMCISION 160 MG/5 ML
40.0000 mg | ORAL | Status: DC | PRN
Start: 2019-05-08 — End: 2019-05-08

## 2019-05-08 MED ORDER — BENZOCAINE-MENTHOL 20-0.5 % EX AERO
1.0000 "application " | INHALATION_SPRAY | CUTANEOUS | Status: DC | PRN
  Administered 2019-05-08: 1 via TOPICAL
  Filled 2019-05-08: qty 56

## 2019-05-08 MED ORDER — WHITE PETROLATUM EX OINT: 1.0000 "application " | TOPICAL_OINTMENT | CUTANEOUS | Status: DC | PRN

## 2019-05-08 MED ORDER — DIBUCAINE (PERIANAL) 1 % EX OINT: 1.0000 "application " | TOPICAL_OINTMENT | CUTANEOUS | Status: DC | PRN

## 2019-05-08 MED ORDER — COCONUT OIL OIL: 1.0000 "application " | TOPICAL_OIL | Status: DC | PRN

## 2019-05-08 MED ORDER — ONDANSETRON HCL 4 MG/2ML IJ SOLN: 4.0000 mg | INTRAMUSCULAR | Status: DC | PRN

## 2019-05-08 MED ORDER — SUCROSE 24% NICU/PEDS ORAL SOLUTION: 0.5000 mL | OROMUCOSAL | Status: DC | PRN

## 2019-05-08 MED ORDER — ACETAMINOPHEN FOR CIRCUMCISION 160 MG/5 ML: 40.0000 mg | Freq: Once | ORAL | Status: DC

## 2019-05-08 NOTE — Discharge Instructions (Signed)

## 2019-05-08 NOTE — Lactation Note (Addendum)
This note was copied from a baby's chart. Lactation Consultation Note: Staff Nurse Verlee Rossetti reports that per mother, plans to formula feed only.   Patient Name: Meghan Warren Date: 05/08/2019     Maternal Data    Feeding    LATCH Score                   Interventions    Lactation Tools Discussed/Used     Consult Status      Meghan Warren [facility] 05/08/2019, 4:11 PM

## 2019-05-08 NOTE — Discharge Summary (Signed)
Postpartum Discharge Summary    Patient Name: Meghan Warren DOB: 08/12/1995 MRN: [id number]  Date of admission: 05/07/2019 Delivering Provider: Merilyn Baba   Date of discharge: 05/09/2019  Admitting diagnosis: Normal labor [O80, Z37.9] Intrauterine pregnancy: [redacted]w[redacted]d    Secondary diagnosis:  Active Problems:   Normal labor  Additional problems: precipitous delivery     Discharge diagnosis: Term Pregnancy Delivered                                                                                                Post partum procedures:Depo  Augmentation: None  Complications: None  Hospital course:  Onset of Labor With Vaginal Delivery     24y.o. yo G1P1001 at 326w1das admitted in Active Labor on 05/07/2019. Patient had an uncomplicated labor course as follows:   Admitted at 5/24/-1. Quickly progressed without augmentation to complete, SROMed, delivering shortly thereafter  Membrane Rupture Time/Date: 11:42 PM ,05/07/2019   Intrapartum Procedures: Episiotomy: None [1]                                         Lacerations:  Labial [10];Periurethral [8]  Patient had a delivery of a Viable infant. 05/07/2019  Information for the patient's newborn:  HaKanika, Bungert0[[id number]]Delivery Method: Vaginal, Spontaneous(Filed from Delivery Summary)     Pateint had an uncomplicated postpartum course.  She is ambulating, tolerating a regular diet, passing flatus, and urinating well. Patient is discharged home in stable condition on 05/09/19.  Delivery time: 11:43 PM    Magnesium Sulfate received: No BMZ received: No Rhophylac:N/A MMR:N/A Transfusion:No  Physical exam  Vitals:   05/08/19 1144 05/08/19 1542 05/08/19 2122 05/09/19 0504  BP: 128/78 118/77 117/74 105/73  Pulse: 82 75 71 76  Resp: _0 Temp: 98 F (36.7 C) 98.3 F (36.8 C) 98.9 F (37.2 C) 98.8 F (37.1 C)  TempSrc: Oral Oral Oral Oral  SpO2: 98% 100% 100% 100%  Weight:       General: alert,  cooperative and no distress Lochia: appropriate Uterine Fundus: firm Incision: N/A DVT Evaluation: No evidence of DVT seen on physical exam. No significant calf/ankle edema. Labs: Lab Results  Component Value Date   WBC 19.8 (H) 05/08/2019   HGB 11.6 (L) 05/08/2019   HCT 36.1 05/08/2019   MCV 87.2 05/08/2019   PLT 285 05/08/2019   CMP Latest Ref Rng & Units 05/22/2016  Glucose 65 - 99 mg/dL 100(H)  BUN 6 - 20 mg/dL 7  Creatinine 0.44 - 1.00 mg/dL 0.58  Sodium 135 - 145 mmol/L 136  Potassium 3.5 - 5.1 mmol/L 3.5  Chloride 101 - 111 mmol/L 105  CO2 22 - 32 mmol/L 22  Calcium 8.9 - 10.3 mg/dL 8.9  Total Protein 6.5 - 8.1 g/dL -  Total Bilirubin 0.3 - 1.2 mg/dL -  Alkaline Phos 38 - 126 U/L -  AST 15 - 41 U/L -  ALT 14 - 54 U/L -   Edinburgh Score:  Edinburgh Postnatal Depression Scale Screening Tool 05/08/2019  I have been able to laugh and see the funny side of things. (No Data)    Discharge instruction: per After Visit Summary and "Baby and Me Booklet".  After visit meds:  Allergies as of 05/09/2019   No Known Allergies     Medication List    TAKE these medications   acetaminophen 325 MG tablet Commonly known as: Tylenol Take 2 tablets (650 mg total) by mouth every 4 (four) hours as needed (for pain scale < 4).   ibuprofen 600 MG tablet Commonly known as: ADVIL Take 1 tablet (600 mg total) by mouth every 6 (six) hours.   prenatal multivitamin Tabs tablet Take 1 tablet by mouth daily at 12 noon.       Diet: routine diet  Activity: Advance as tolerated. Pelvic rest for 6 weeks.   Outpatient follow up:6 weeks Follow up Appt:No future appointments. Follow up Visit:  Please schedule this patient for Postpartum visit in: 6 weeks with the following provider: Any provider at [place] patients schedule nurse incision check in weeks 2 weeks: no Low risk pregnancy complicated by: Chlamydia infection in early pregnancy Delivery mode:  SVD Anticipated  Birth Control:  Depo PP Procedures needed: None  Schedule Integrated BH visit: no  Newborn Data: Live born female  Birth Weight: 5 lb 14.2 oz (2671 g) APGAR: 9, 9  Newborn Delivery   Birth date/time: 05/07/2019 23:43:00 Delivery type: Vaginal, Spontaneous      Baby Feeding: Bottle and Breast Disposition:home with mother   05/09/2019 Clarnce Flock, MD

## 2019-05-08 NOTE — Clinical Social Work Maternal (Signed)
CLINICAL SOCIAL WORK MATERNAL/CHILD NOTE  Patient Details  Name: Meghan Warren MRN: [id number] Date of Birth: 11-14-1995  Date:  05/08/2019  Clinical Social Worker Initiating Note:  Georgeanne Nim, MSW, LCSWA Date/Time: Initiated:  05/08/19/1500     Child's Name:  Meghan Warren   Biological Parents:  Mother, Father(Meghan Warren, 24 y/o, [phone])   Need for Interpreter:  None   Reason for Referral:  Current Substance Use/Substance Use During Pregnancy    Address:  [place] [address]    Phone number:  [phone] (home)     Additional phone number: [phone]  Household Members/Support Persons (HM/SP):   Household Member/Support Person 1, Household Member/Support Person 2   HM/SP Name Relationship DOB or Age  HM/SP -24 Physiological scientist mom    HM/SP -2 [employer] step-father    HM/SP -3        HM/SP -4        HM/SP -5        HM/SP -6        HM/SP -7        HM/SP -8          Natural Supports (not living in the home):  Immediate Family, Extended Family, Spouse/significant other, Friends   Chiropodist:     Employment: Unemployed   Type of Work:     Education:  Programmer, systems   Homebound arranged:    Museum/gallery curator Resources:  Medicaid   Other Resources:  Physicist, medical , [place] Considerations Which May Impact Care:  none stated  Strengths:  Home prepared for child    Psychotropic Medications:         Pediatrician:     CSW provided Meghan Warren with Pediatrician list. Meghan Warren is reviewing options.   Pediatrician List:   [facility]      Pediatrician Fax Number:    Risk Factors/Current Problems:  Substance Use    Cognitive State:  Able to Concentrate , Alert    Mood/Affect:  Calm , Happy , Relaxed , Comfortable    CSW Assessment: CSW received consult for hx of THC use during pregancy.    CSW met with Meghan Warren at  bedside to offer support and complete assessment. MGM,  maternal aunt, and infant Alvis Lemmings were present on arrival, however, MGM  and MA stepped out of room, after PPD/A and SIDS education, to offer Meghan Warren privacy during assessment. Meghan Warren, MGM, and maternal aunt were pleasant and engaged during visit.  Meghan Warren reported daily marijuana use during pregnancy. Meghan Warren reported marijuana was helpful with appetite and anxiety. Meghan Warren denied any other substance use. Meghan Warren stated she is not sure of plans for future THC use. Meghan Warren declined substance abuse treatment resources.  Meghan Warren denied any mental health dx history or concerns even after stating anxiety as one reason for THC use. CSW offered resource list for counseling services. Meghan Warren was receptive and agreed to consider counseling as an alternative to [facility] use. Meghan Warren denied any SI, HI, or domestic violence. Meghan Warren reported she is "excited and happy" about infant. Meghan Warren identified mom, grandma, FOB, and 2 sisters as support. CSW informed Meghan Warren of hospital's infant drug screen policy, pending UDS and CDS results, and warranted CPS report due to Meghan Warren's reported usage throughout pregnancy and Meghan Warren's positive drug test 12/16/18, 03/24/19, and 04/21/19. Meghan Warren stated understanding and denied any questions.  CSW provided education regarding the baby blues period vs. perinatal mood disorders, discussed treatment and gave resources for mental health follow up if concerns arise.  CSW recommends self-evaluation during the postpartum time period using the New Mom Checklist from Postpartum Progress and encouraged Meghan Warren, MGM, and maternal aunt to contact a medical professional if symptoms are noted at any time.  Meghan Warren, MGM, and maternal aunt denied any questions.   CSW provided review of Sudden Infant Death Syndrome (SIDS) precautions. Meghan Warren confirmed having all needed items for baby including car seat and bassinet for baby's safe sleep.    CSW made CPS report with [facility] CPS. Report was made with CPS investigator C.  Key. Mr. Gita Kudo was not able to confirm if report will be screened in. Mr. Gita Kudo agreed to call this CSW back to confirm.   CSW Plan/Description:  Sudden Infant Death Syndrome (SIDS) Education, Perinatal Mood and Anxiety Disorder (PMADs) Education, Hospital Drug Screen Policy Information, Child Protective Service Report , CSW Will Continue to Monitor Umbilical Cord Tissue Drug Screen Results and Make Report if Warranted    Evander Macaraeg D. Lissa Morales, MSW, [facility] Clinical Social Worker [phone] 05/08/2019, 6:03 PM

## 2019-05-08 NOTE — Progress Notes (Signed)
Post Partum Day 1 Subjective: Patient reports feeling well. She is tolerating PO. Urinating without difficulty. Lochia minimal. She has not yet eaten anything.  Objective: Blood pressure 111/75, pulse 73, temperature 98.1 F (36.7 C), temperature source Oral, resp. rate 16, weight 86.6 kg, last menstrual period 08/13/2018, SpO2 100 %, unknown if currently breastfeeding.  Physical Exam:  General: alert, cooperative and appears stated age Lochia: appropriate Uterine Fundus: firm Incision: NA DVT Evaluation: No evidence of DVT seen on physical exam.  Recent Labs    05/07/19 2326 05/08/19 0456  HGB 12.3 11.6*  HCT 38.5 36.1    Assessment/Plan: Plan for discharge tomorrow; inadequate GBS ppx. Vitals stable Desires Depo; ordered Breast and bottle feeding   LOS: 1 day   Meghan Warren 05/08/2019, 9:41 AM

## 2019-05-09 DIAGNOSIS — Z3A38 38 weeks gestation of pregnancy: Secondary | ICD-10-CM

## 2019-05-09 DIAGNOSIS — O99824 Streptococcus B carrier state complicating childbirth: Secondary | ICD-10-CM

## 2019-05-09 DIAGNOSIS — O99333 Smoking (tobacco) complicating pregnancy, third trimester: Secondary | ICD-10-CM

## 2019-05-09 DIAGNOSIS — O99323 Drug use complicating pregnancy, third trimester: Secondary | ICD-10-CM

## 2019-05-09 DIAGNOSIS — F1210 Cannabis abuse, uncomplicated: Secondary | ICD-10-CM

## 2019-05-09 DIAGNOSIS — Z8619 Personal history of other infectious and parasitic diseases: Secondary | ICD-10-CM

## 2019-05-09 MED ORDER — ACETAMINOPHEN 325 MG PO TABS: 650.0000 mg | ORAL_TABLET | ORAL | 0 refills | Status: DC | PRN

## 2019-05-09 MED ORDER — IBUPROFEN 600 MG PO TABS: 600.0000 mg | ORAL_TABLET | Freq: Four times a day (QID) | ORAL | 0 refills | Status: DC

## 2019-05-09 MED ORDER — KETOROLAC TROMETHAMINE 30 MG/ML IJ SOLN
INTRAMUSCULAR | Status: AC
  Filled 2019-05-09: qty 1

## 2019-05-09 NOTE — Progress Notes (Signed)
This CSW notified that case was assigned to Beckie Busing. with [facility] CPS [phone]. CSW aware that infants UDS is still pending at this time.     Claude Manges Czar Ysaguirre, MSW, LCSW [facility] [place] [phone]

## 2020-04-26 ENCOUNTER — Ambulatory Visit (HOSPITAL_COMMUNITY)
Admission: EM | Admit: 2020-04-26 | Discharge: 2020-04-26 | Disposition: A | Discharge status: Home / Self Care | Payer: Medicaid Other | Attending: Urgent Care | Admitting: Urgent Care

## 2020-04-26 ENCOUNTER — Other Ambulatory Visit: Payer: Self-pay

## 2020-04-26 ENCOUNTER — Encounter (HOSPITAL_COMMUNITY): Payer: Self-pay

## 2020-04-26 DIAGNOSIS — J069 Acute upper respiratory infection, unspecified: Secondary | ICD-10-CM | POA: Diagnosis not present

## 2020-04-26 DIAGNOSIS — R0981 Nasal congestion: Secondary | ICD-10-CM

## 2020-04-26 MED ORDER — PROMETHAZINE-DM 6.25-15 MG/5ML PO SYRP
5.0000 mL | ORAL_SOLUTION | Freq: Every evening | ORAL | 0 refills | Status: AC | PRN
Start: 2020-04-26 — End: ?

## 2020-04-26 MED ORDER — PSEUDOEPHEDRINE HCL 60 MG PO TABS
60.0000 mg | ORAL_TABLET | Freq: Three times a day (TID) | ORAL | 0 refills | Status: DC | PRN
Start: 2020-04-26 — End: 2021-03-29

## 2020-04-26 MED ORDER — BENZONATATE 100 MG PO CAPS: 100.0000 mg | ORAL_CAPSULE | Freq: Three times a day (TID) | ORAL | 0 refills | Status: DC | PRN

## 2020-04-26 MED ORDER — CETIRIZINE HCL 10 MG PO TABS
10.0000 mg | ORAL_TABLET | Freq: Every day | ORAL | 0 refills | Status: DC
Start: 2020-04-26 — End: 2021-03-29

## 2020-04-26 NOTE — ED Provider Notes (Signed)
[facility]   MRN: [id number] DOB: 1995/03/14  Subjective:   Meghan Warren is a 25 y.o. female presenting for 4-day history of persistent sinus congestion, postnasal drainage, coughing.  Patient denies fever, chest pain, shortness of breath, body aches.  She has used over-the-counter cold medicine with some relief.  Wants to make sure she gets a note for her work.  No current facility-administered medications for this encounter.  Current Outpatient Medications:  .  acetaminophen (TYLENOL) 325 MG tablet, Take 2 tablets (650 mg total) by mouth every 4 (four) hours as needed (for pain scale < 4)., Disp: 30 tablet, Rfl: 0 .  ibuprofen (ADVIL) 600 MG tablet, Take 1 tablet (600 mg total) by mouth every 6 (six) hours., Disp: 30 tablet, Rfl: 0 .  Prenatal Vit-Fe Fumarate-FA (PRENATAL MULTIVITAMIN) TABS tablet, Take 1 tablet by mouth daily at 12 noon., Disp: , Rfl:    No Known Allergies  Past Medical History:  Diagnosis Date  . Medical history non-contributory      Past Surgical History:  Procedure Laterality Date  . NO PAST SURGERIES      Family History  Problem Relation Age of Onset  . Hypertension Mother   . Healthy Father     Social History   Tobacco Use  . Smoking status: Current Every Day Smoker    Packs/day: 0.00  . Smokeless tobacco: Never Used  . Tobacco comment: 1-2 blacks/day  Vaping Use  . Vaping Use: Never used  Substance Use Topics  . Alcohol use: No  . Drug use: Yes    Types: Marijuana    Comment: last used 05/07/19    ROS   Objective:   Vitals: BP 107/72 (BP Location: Right Arm)   Pulse 84   Temp 98.6 F (37 C) (Oral)   Resp 16   SpO2 98%   Physical Exam Constitutional:      General: She is not in acute distress.    Appearance: She is well-developed. She is not ill-appearing, toxic-appearing or diaphoretic.  HENT:     Head: Normocephalic and atraumatic.     Right Ear: Tympanic membrane, ear canal and external ear  normal. No drainage or tenderness. No middle ear effusion. Tympanic membrane is not erythematous.     Left Ear: Tympanic membrane, ear canal and external ear normal. No drainage or tenderness.  No middle ear effusion. Tympanic membrane is not erythematous.     Nose: Congestion and rhinorrhea present.     Mouth/Throat:     Mouth: Mucous membranes are moist. No oral lesions.     Pharynx: Oropharynx is clear. No pharyngeal swelling, oropharyngeal exudate, posterior oropharyngeal erythema or uvula swelling.     Tonsils: No tonsillar exudate or tonsillar abscesses.  Eyes:     General: No scleral icterus.       Right eye: No discharge.        Left eye: No discharge.     Extraocular Movements: Extraocular movements intact.     Right eye: Normal extraocular motion.     Left eye: Normal extraocular motion.     Conjunctiva/sclera: Conjunctivae normal.     Pupils: Pupils are equal, round, and reactive to light.  Cardiovascular:     Rate and Rhythm: Normal rate.  Pulmonary:     Effort: Pulmonary effort is normal.  Musculoskeletal:     Cervical back: Normal range of motion and neck supple.  Lymphadenopathy:     Cervical: No cervical adenopathy.  Skin:  General: Skin is warm and dry.  Neurological:     General: No focal deficit present.     Mental Status: She is alert and oriented to person, place, and time.     Motor: No weakness.     Coordination: Coordination normal.     Gait: Gait normal.     Deep Tendon Reflexes: Reflexes normal.  Psychiatric:        Mood and Affect: Mood normal.        Behavior: Behavior normal.        Thought Content: Thought content normal.        Judgment: Judgment normal.      Assessment and Plan :   PDMP not reviewed this encounter.  1. Viral URI with cough   2. Nasal congestion     Will manage for viral illness such as viral URI, viral syndrome, viral rhinitis, COVID-19. Counseled patient on nature of COVID-19 including modes of transmission,  diagnostic testing, management and supportive care.  Offered scripts for symptomatic relief. COVID 19 testing is pending. Counseled patient on potential for adverse effects with medications prescribed/recommended today, ER and return-to-clinic precautions discussed, patient verbalized understanding.     Wallis Bamberg, PA-C 04/26/20 1624

## 2020-04-26 NOTE — ED Triage Notes (Signed)
Patient presents to Urgent Care with complaints of cough and nasal congestion since Sunday. Treating symptoms with otc meds.  Denies fever, n/v, or diarrhea.

## 2020-06-08 ENCOUNTER — Encounter (HOSPITAL_COMMUNITY): Payer: Self-pay | Admitting: Emergency Medicine

## 2020-06-08 ENCOUNTER — Ambulatory Visit (HOSPITAL_COMMUNITY)
Admission: EM | Admit: 2020-06-08 | Discharge: 2020-06-08 | Disposition: A | Discharge status: Home / Self Care | Payer: Medicaid Other | Attending: Family Medicine | Admitting: Family Medicine

## 2020-06-08 ENCOUNTER — Other Ambulatory Visit: Payer: Self-pay

## 2020-06-08 DIAGNOSIS — Z20822 Contact with and (suspected) exposure to covid-19: Secondary | ICD-10-CM | POA: Diagnosis not present

## 2020-06-08 DIAGNOSIS — J069 Acute upper respiratory infection, unspecified: Secondary | ICD-10-CM

## 2020-06-08 DIAGNOSIS — Z8616 Personal history of COVID-19: Secondary | ICD-10-CM | POA: Diagnosis not present

## 2020-06-08 DIAGNOSIS — F17290 Nicotine dependence, other tobacco product, uncomplicated: Secondary | ICD-10-CM | POA: Diagnosis not present

## 2020-06-08 LAB — SARS CORONAVIRUS 2 (TAT 6-24 HRS): SARS Coronavirus 2: NEGATIVE

## 2020-06-08 MED ORDER — FLUTICASONE PROPIONATE 50 MCG/ACT NA SUSP
1.0000 | Freq: Two times a day (BID) | NASAL | 0 refills | Status: DC
Start: 2020-06-08 — End: 2021-03-29

## 2020-06-08 NOTE — ED Triage Notes (Signed)
Got COVID vaccine a week ago. Reports congestion and headache.

## 2020-06-08 NOTE — ED Provider Notes (Signed)
[facility]    CSN: [id number] Arrival date & time: 06/08/20  1102      History   Chief Complaint Chief Complaint  Patient presents with  . Nasal Congestion  . Headache    HPI Meghan Warren is a 25 y.o. female.   Patient here today with about a week of sinus pressure, congestion, runny nose, sinus headache.  Denies fever, chills, body aches, chest pain, shortness of breath, abdominal pain, nausea vomiting or diarrhea.  Got her COVID-vaccine about a week ago prior to onset of symptoms.  No known sick contacts recently.  So far took some Tylenol but otherwise not try anything over-the-counter for symptoms.  Denies history of seasonal allergies, asthma.     Past Medical History:  Diagnosis Date  . Medical history non-contributory     Patient Active Problem List   Diagnosis Date Noted  . Normal labor 05/07/2019  . COVID-19 01/14/2019  . Chlamydia infection affecting pregnancy in first trimester 11/03/2018    Past Surgical History:  Procedure Laterality Date  . NO PAST SURGERIES      OB History    Gravida  1   Para  1   Term  1   Preterm  0   AB  0   Living  1     SAB  0   IAB  0   Ectopic  0   Multiple  0   Live Births  1            Home Medications    Prior to Admission medications   Medication Sig Start Date End Date Taking? Authorizing Provider  fluticasone (FLONASE) 50 MCG/ACT nasal spray Place 1 spray into both nostrils in the morning and at bedtime. 06/08/20  Yes Particia Nearing, PA-C  acetaminophen (TYLENOL) 325 MG tablet Take 2 tablets (650 mg total) by mouth every 4 (four) hours as needed (for pain scale < 4). 05/09/19   Venora Maples, MD  benzonatate (TESSALON) 100 MG capsule Take 1-2 capsules (100-200 mg total) by mouth 3 (three) times daily as needed for cough. 04/26/20   Wallis Bamberg, PA-C  cetirizine (ZYRTEC ALLERGY) 10 MG tablet Take 1 tablet (10 mg total) by mouth daily. 04/26/20   Wallis Bamberg, PA-C   ibuprofen (ADVIL) 600 MG tablet Take 1 tablet (600 mg total) by mouth every 6 (six) hours. 05/09/19   Venora Maples, MD  Prenatal Vit-Fe Fumarate-FA (PRENATAL MULTIVITAMIN) TABS tablet Take 1 tablet by mouth daily at 12 noon.    [provider]  promethazine-dextromethorphan (PROMETHAZINE-DM) 6.25-15 MG/5ML syrup Take 5 mLs by mouth at bedtime as needed for cough. 04/26/20   Wallis Bamberg, PA-C  pseudoephedrine (SUDAFED) 60 MG tablet Take 1 tablet (60 mg total) by mouth every 8 (eight) hours as needed for congestion. 04/26/20   Wallis Bamberg, PA-C    Family History Family History  Problem Relation Age of Onset  . Hypertension Mother   . Healthy Father     Social History Social History   Tobacco Use  . Smoking status: Current Every Day Smoker    Packs/day: 0.00  . Smokeless tobacco: Never Used  . Tobacco comment: 1-2 blacks/day  Vaping Use  . Vaping Use: Never used  Substance Use Topics  . Alcohol use: No  . Drug use: Yes    Types: Marijuana    Comment: last used 05/07/19     Allergies   Patient has no known allergies.   Review  of Systems Review of Systems Per HPI  Physical Exam Triage Vital Signs ED Triage Vitals  Enc Vitals Group     BP 06/08/20 1218 108/70     Pulse Rate 06/08/20 1217 83     Resp 06/08/20 1217 16     Temp 06/08/20 1217 98.8 F (37.1 C)     Temp Source 06/08/20 1217 Oral     SpO2 06/08/20 1217 99 %     Weight --      Height --      Head Circumference --      Peak Flow --      Pain Score 06/08/20 1217 0     Pain Loc --      Pain Edu? --      Excl. in GC? --    No data found.  Updated Vital Signs BP 108/70   Pulse 83   Temp 98.8 F (37.1 C) (Oral)   Resp 16   SpO2 99%   Visual Acuity Right Eye Distance:   Left Eye Distance:   Bilateral Distance:    Right Eye Near:   Left Eye Near:    Bilateral Near:     Physical Exam Vitals and nursing note reviewed.  Constitutional:      Appearance: Normal appearance. She is  well-developed. She is not ill-appearing.  HENT:     Head: Atraumatic.     Right Ear: Tympanic membrane normal.     Left Ear: Tympanic membrane normal.     Nose: Rhinorrhea present.     Mouth/Throat:     Mouth: Mucous membranes are moist.     Pharynx: Posterior oropharyngeal erythema present. No oropharyngeal exudate.  Eyes:     Extraocular Movements: Extraocular movements intact.     Conjunctiva/sclera: Conjunctivae normal.  Cardiovascular:     Rate and Rhythm: Normal rate and regular rhythm.     Heart sounds: Normal heart sounds.  Pulmonary:     Effort: Pulmonary effort is normal. No respiratory distress.     Breath sounds: Normal breath sounds. No wheezing or rales.  Abdominal:     General: Bowel sounds are normal. There is no distension.     Palpations: Abdomen is soft.     Tenderness: There is no abdominal tenderness. There is no guarding.  Musculoskeletal:        General: Normal range of motion.     Cervical back: Normal range of motion and neck supple.  Skin:    General: Skin is warm and dry.  Neurological:     Mental Status: She is alert and oriented to person, place, and time.  Psychiatric:        Mood and Affect: Mood normal.        Thought Content: Thought content normal.        Judgment: Judgment normal.    UC Treatments / Results  Labs (all labs ordered are listed, but only abnormal results are displayed) Labs Reviewed  SARS CORONAVIRUS 2 (TAT 6-24 HRS)    EKG   Radiology No results found.  Procedures Procedures (including critical care time)  Medications Ordered in UC Medications - No data to display  Initial Impression / Assessment and Plan / UC Course  I have reviewed the triage vital signs and the nursing notes.  Pertinent labs & imaging results that were available during my care of the patient were reviewed by me and considered in my medical decision making (see chart for details).     Exam  and vitals very reassuring today, will give  Flonase and discussed DayQuil, NyQuil, allergy medications, sinus rinses.  No evidence of bacterial infection today.  COVID PCR pending.  Follow-up with acutely worsening symptoms.  Work note given with Barrister's clerk.  Final Clinical Impressions(s) / UC Diagnoses   Final diagnoses:  Viral URI with cough   Discharge Instructions   None    ED Prescriptions    Medication Sig Dispense Auth. Provider   fluticasone (FLONASE) 50 MCG/ACT nasal spray Place 1 spray into both nostrils in the morning and at bedtime. 16 g Particia Nearing, [place]     PDMP not reviewed this encounter.   Particia Nearing, [place] 06/08/20 1313

## 2020-07-15 IMAGING — US US OB COMP LESS 14 WK
1 series · 15 of 18 positions shown · non-contrast
Comparison: None.

CLINICAL DATA: 22-year-old pregnant female with postcoital
bleeding. LMP: 08/13/2018 corresponding to an estimated gestational
age of 9 weeks, 6 days.

EXAM:
OBSTETRIC <14 WK ULTRASOUND
TECHNIQUE: Transabdominal ultrasound was performed for evaluation of the
gestation as well as the maternal uterus and adnexal regions.

[Series 1: us ob comp less 14 wk · 15 of 18 slices shown]
[im 1/18]
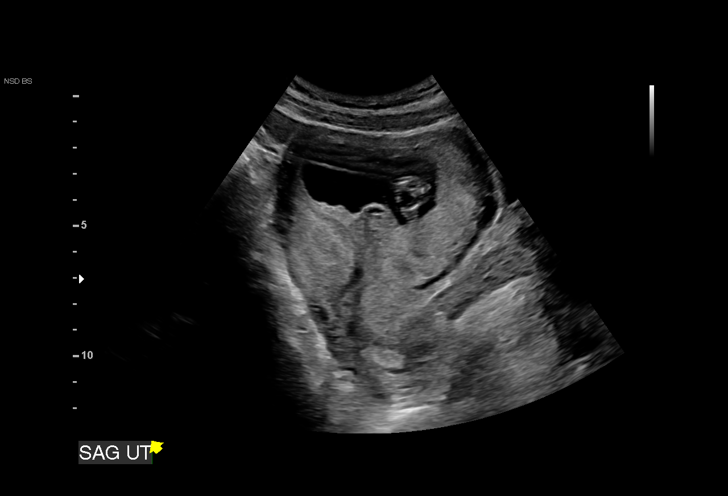
[im 2/18]
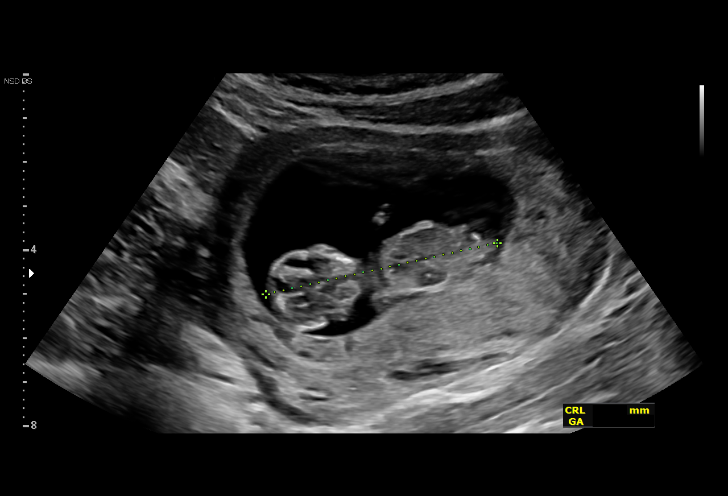
[im 4/18]
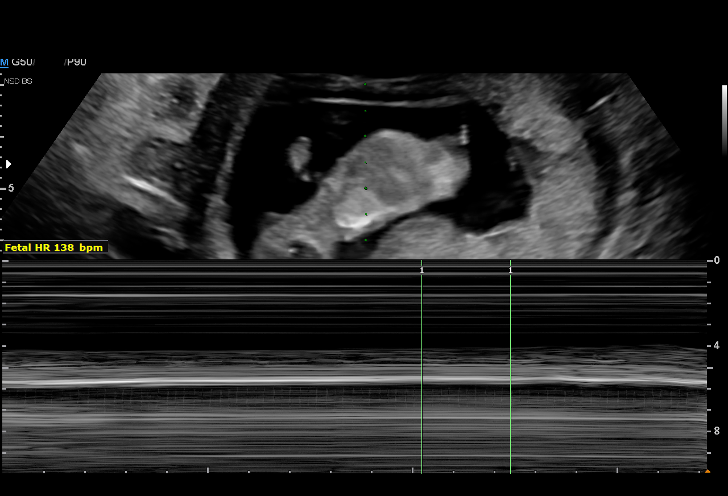
[im 5/18]
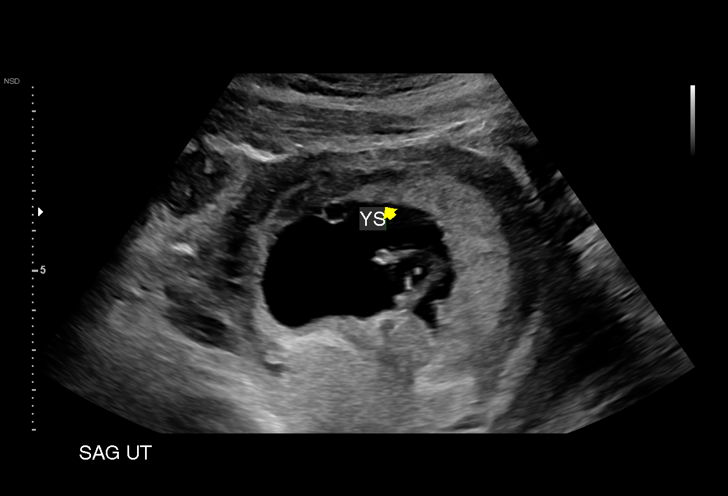
[im 6/18]
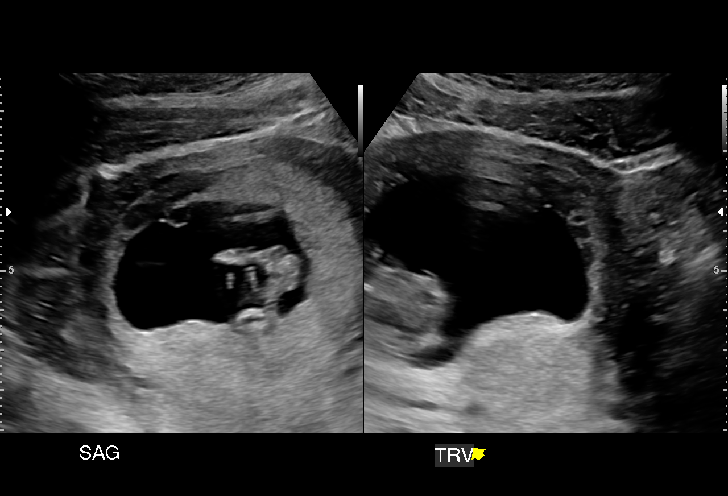
[im 7/18]
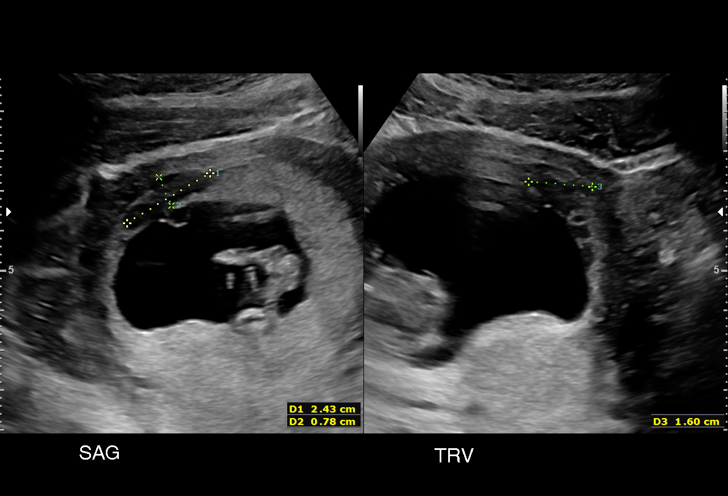
[im 8/18]
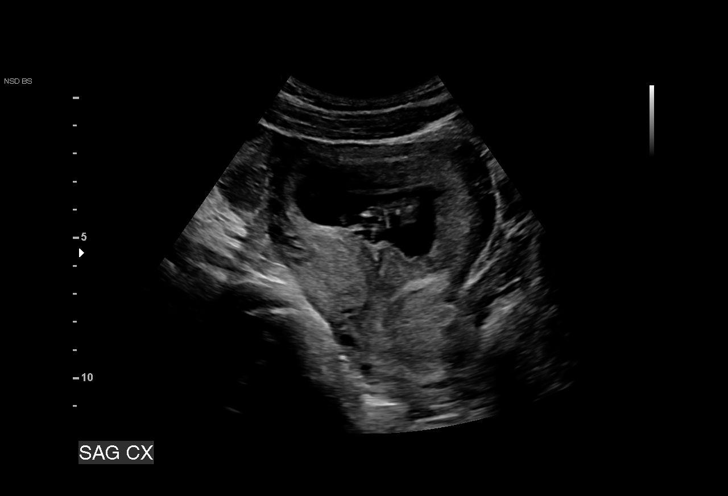
[im 10/18]
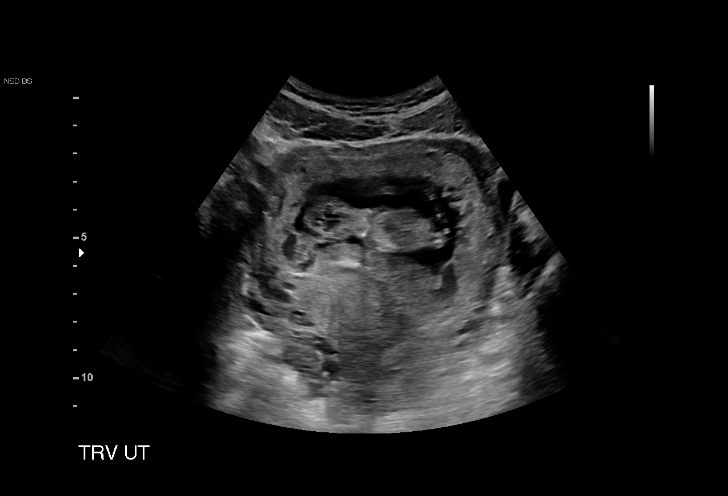
[im 11/18]
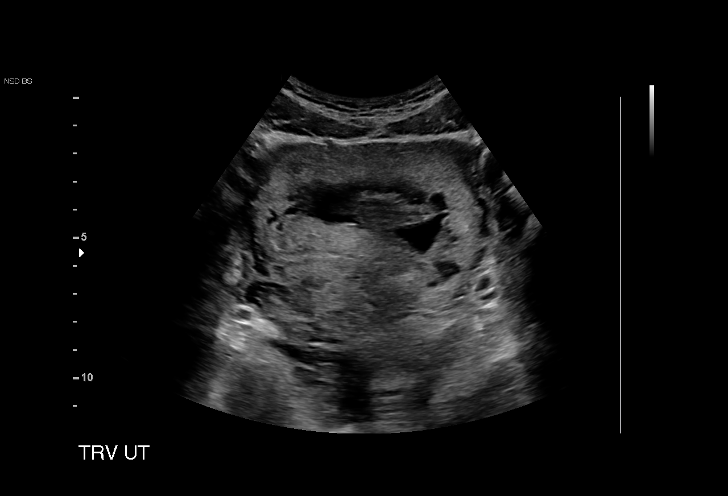
[im 12/18]
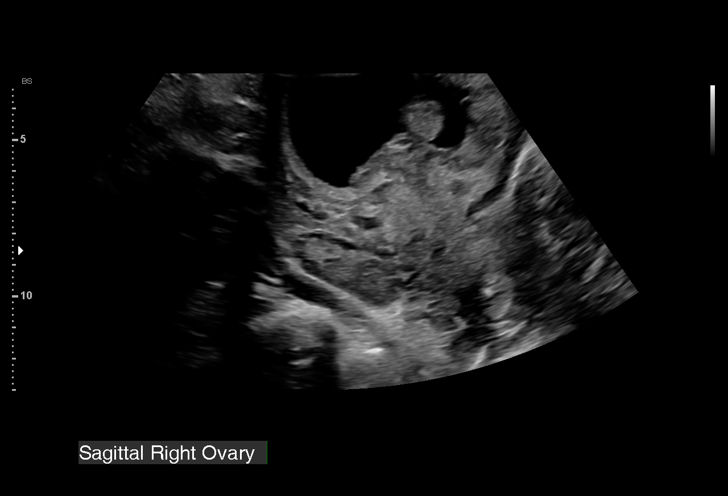
[im 13/18]
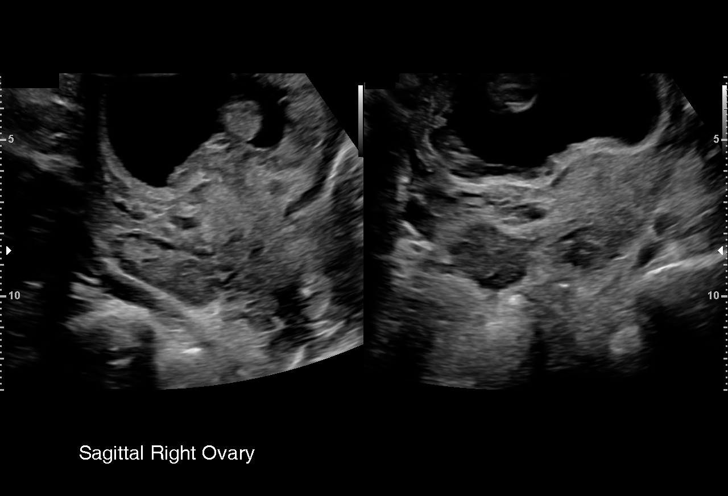
[im 14/18]
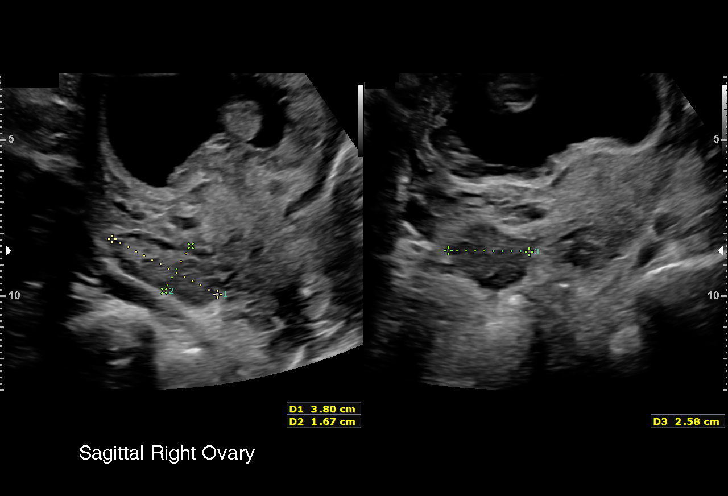
[im 16/18]
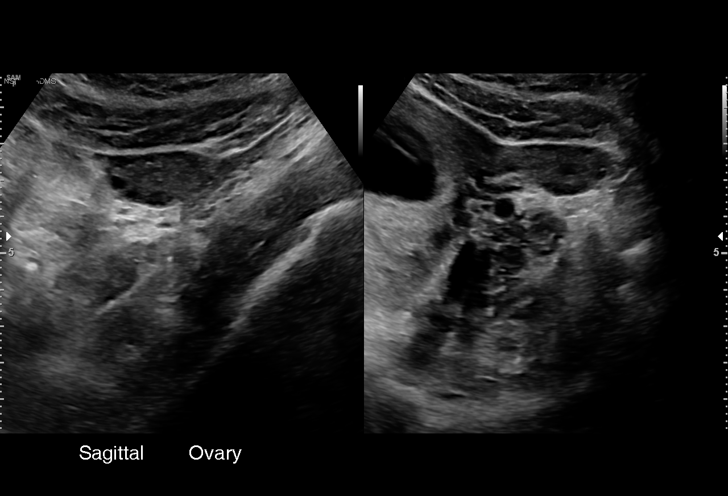
[im 17/18]
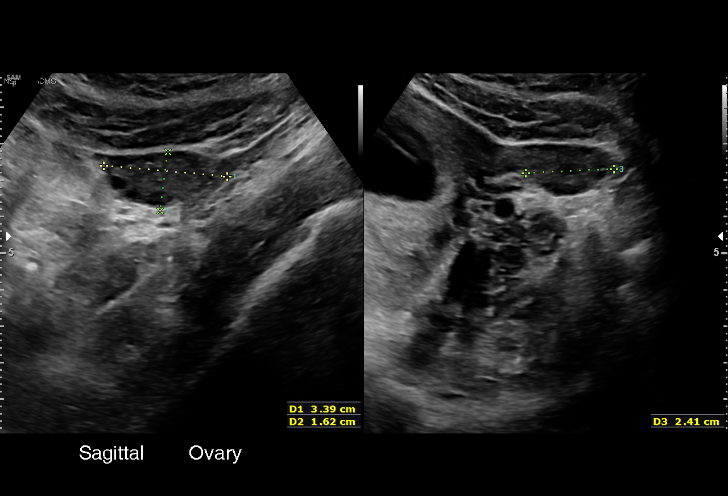
[im 18/18]
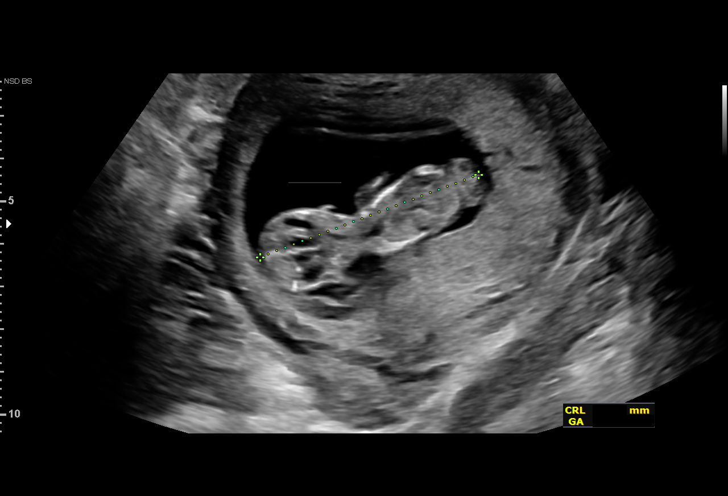

[15 of 18 positions shown; findings below may reference images not displayed]

FINDINGS: Intrauterine gestational sac: Single intrauterine gestational sac.

Yolk sac:  Seen

Embryo:  Present

Cardiac Activity: Detected

Heart Rate: 138 bpm

CRL: 55 mm   12 w 0 d                  US EDC: 05/15/2019

Subchorionic hemorrhage: There is a 2.4 x 0.8 x 1.6 cm subchorionic
hemorrhage.

Maternal uterus/adnexae: The maternal ovaries are unremarkable.

No free fluid within the pelvis.
IMPRESSION: Single live intrauterine pregnancy with an estimated gestational age
of 12 weeks, 0 days based on today's ultrasound.

## 2021-03-29 ENCOUNTER — Encounter (HOSPITAL_COMMUNITY): Payer: Self-pay

## 2021-03-29 ENCOUNTER — Emergency Department (HOSPITAL_COMMUNITY)
Admission: EM | Admit: 2021-03-29 | Discharge: 2021-03-29 | Disposition: A | Discharge status: Home / Self Care | Payer: Medicaid Other | Attending: Emergency Medicine | Admitting: Emergency Medicine

## 2021-03-29 ENCOUNTER — Other Ambulatory Visit: Payer: Self-pay

## 2021-03-29 DIAGNOSIS — H5333 Simultaneous visual perception without fusion: Secondary | ICD-10-CM | POA: Insufficient documentation

## 2021-03-29 DIAGNOSIS — H538 Other visual disturbances: Secondary | ICD-10-CM | POA: Diagnosis present

## 2021-03-29 DIAGNOSIS — H539 Unspecified visual disturbance: Secondary | ICD-10-CM

## 2021-03-29 LAB — CBG MONITORING, ED: Glucose-Capillary: 84 mg/dL (ref 70–99)

## 2021-03-29 MED ORDER — TETRACAINE HCL 0.5 % OP SOLN
2.0000 [drp] | Freq: Once | OPHTHALMIC | Status: AC
  Administered 2021-03-29: 2 [drp] via OPHTHALMIC
  Filled 2021-03-29: qty 4

## 2021-03-29 MED ORDER — FLUORESCEIN SODIUM 1 MG OP STRP: 1.0000 | ORAL_STRIP | Freq: Once | OPHTHALMIC | Status: DC

## 2021-03-29 NOTE — ED Provider Notes (Incomplete)
?  [facility] EMERGENCY DEPARTMENT ?Provider Note ? ? ?CSN: [id number] ?Arrival date & time: 03/29/21  2549 ? ?  ? ?History ?{Add pertinent medical, surgical, social history, OB history to HPI:1} ?Chief Complaint  ?Patient presents with  ? Eye Problem  ? ? ?Meghan Warren is a 26 y.o. female. ? ?HPI ? ?  ? ?Home Medications ?Prior to Admission medications   ?Medication Sig Start Date End Date Taking? Authorizing Provider  ?promethazine-dextromethorphan (PROMETHAZINE-DM) 6.25-15 MG/5ML syrup Take 5 mLs by mouth at bedtime as needed for cough. ?Patient not taking: Reported on 03/29/2021 04/26/20   Wallis Bamberg, PA-C  ?   ? ?Allergies    ?Patient has no known allergies.   ? ?Review of Systems   ?Review of Systems ? ?Physical Exam ?Updated Vital Signs ?BP 109/72 (BP Location: Right Arm)   Pulse 74   Temp 98.6 ?F (37 ?C) (Oral)   Resp 14   SpO2 100%  ?Physical Exam ? ?ED Results / Procedures / Treatments   ?Labs ?(all labs ordered are listed, but only abnormal results are displayed) ?Labs Reviewed - No data to display ? ?EKG ?None ? ?Radiology ?No results found. ? ?Procedures ?Procedures  ?{Document cardiac monitor, telemetry assessment procedure when appropriate:1} ? ?Medications Ordered in ED ?Medications - No data to display ? ?ED Course/ Medical Decision Making/ A&P ?  ?                        ?Medical Decision Making ? ?*** ? ?{Document critical care time when appropriate:1} ?{Document review of labs and clinical decision tools ie heart score, Chads2Vasc2 etc:1}  ?{Document your independent review of radiology images, and any outside records:1} ?{Document your discussion with family members, caretakers, and with consultants:1} ?{Document social determinants of health affecting pt's care:1} ?{Document your decision making why or why not admission, treatments were needed:1} ?Final Clinical Impression(s) / ED Diagnoses ?Final diagnoses:  ?None  ? ? ?Rx / DC Orders ?ED Discharge Orders   ? ? None   ? ?  ? ? ?

## 2021-03-29 NOTE — ED Provider Notes (Signed)
?[facility] EMERGENCY DEPARTMENT ?Provider Note ? ? ?CSN: [id number] ?Arrival date & time: 03/29/21  0355 ? ?  ? ?History ? ?Chief Complaint  ?Patient presents with  ? Eye Problem  ? ? ?Meghan Warren is a 26 y.o. female. ? ?HPI ?Patient is a 26 year old female with no pertinent past medical history presented emergency room today with complaints of bilateral intermittent episodic floaters and occasional flashes of light.  She states that she is any symptoms since 5 days ago they have been intermittent do not seem to have any aggravating relieving factors and she states that she has no pain associated symptoms specifically no eye pain headache neck pain patient has any slurred speech confusion numbness weakness lightheadedness or dizziness. ? ?No family history of MS.  Also denies any facial or eye trauma. ? ?She states that these episodes tend to last.  She states that she has 2 or 3 of them per day she states that it is associated with any other symptoms and not associate with any activities. ?She is not a contact lens wearer.  She denies any abnormal discharge from her eyes no nausea or vomiting ? ?  ? ?Home Medications ?Prior to Admission medications   ?Medication Sig Start Date End Date Taking? Authorizing Provider  ?promethazine-dextromethorphan (PROMETHAZINE-DM) 6.25-15 MG/5ML syrup Take 5 mLs by mouth at bedtime as needed for cough. ?Patient not taking: Reported on 03/29/2021 04/26/20   Wallis Bamberg, PA-C  ?   ? ?Allergies    ?Patient has no known allergies.   ? ?Review of Systems   ?Review of Systems ? ?Physical Exam ?Updated Vital Signs ?BP 109/72 (BP Location: Right Arm)   Pulse 74   Temp 98.6 ?F (37 ?C) (Oral)   Resp 14   SpO2 100%  ?Physical Exam ?Vitals and nursing note reviewed.  ?Constitutional:   ?   General: She is not in acute distress. ?   Appearance: Normal appearance. She is not ill-appearing.  ?HENT:  ?   Head: Normocephalic and atraumatic.  ?   Mouth/Throat:  ?   Mouth:  Mucous membranes are moist.  ?Eyes:  ?   General: No scleral icterus.    ?   Right eye: No discharge.     ?   Left eye: No discharge.  ?   Conjunctiva/sclera: Conjunctivae normal.  ?   Comments: EOMI, pupils reactive to light and accommodation ?Pressures in right and left eye between 14 and 18 (measured each x2)  ?Pulmonary:  ?   Effort: Pulmonary effort is normal.  ?   Breath sounds: No stridor.  ?Skin: ?   General: Skin is warm and dry.  ?Neurological:  ?   Mental Status: She is alert and oriented to person, place, and time. Mental status is at baseline.  ?Psychiatric:     ?   Mood and Affect: Mood normal.     ?   Behavior: Behavior normal.  ? ? ?ED Results / Procedures / Treatments   ?Labs ?(all labs ordered are listed, but only abnormal results are displayed) ?Labs Reviewed  ?CBG MONITORING, ED  ? ? ?EKG ?None ? ?Radiology ?No results found. ? ?Procedures ?Procedures  ? ? ?Medications Ordered in ED ?Medications  ?tetracaine (PONTOCAINE) 0.5 % ophthalmic solution 2 drop (2 drops Both Eyes Given 03/29/21 0948)  ? ? ?ED Course/ Medical Decision Making/ A&P ?  ?                        ?  Medical Decision Making ?Risk ?Prescription drug management. ? ? ?Patient is a 26 year old female with no pertinent past medical history presented emergency room today with complaints of bilateral intermittent episodic floaters and occasional flashes of light.  She states that she is any symptoms since 5 days ago they have been intermittent do not seem to have any aggravating relieving factors and she states that she has no pain associated symptoms specifically no eye pain headache neck pain patient has any slurred speech confusion numbness weakness lightheadedness or dizziness. ? ?No family history of MS.  Also denies any facial or eye trauma. ? ?She states that these episodes tend to last.  She states that she has 2 or 3 of them per day she states that it is associated with any other symptoms and not associate with any  activities. ? ? ?Patient has reassuring exam.  No symptoms currently.  Neurologically intact on my exam.  EOMI, PERRLA, no hyphema or evidence of trauma.  I specifically doubt--given her lack of pain--uveitis, iritis or angle-closure. ?To consider optic neuritis but also less likely given lack of pain.  She is very young low suspicion for giant cell arteritis or similar vasculitis.  Her symptoms have been intermittent and do not seem to be positional she does not have an obese body habitus and I have a relatively low suspicion for intracranial hypertension/pseudotumor.  She is neurologically intact on exam doubt stroke.  She has a BL normal vision and visual acuity.  ? ?We will have patient follow-up with ophthalmology.  Return precautions given for any new or concerning symptoms specifically any fevers, vision loss, eye pain, headache or any other new or concerning symptoms.  Briefly discussed with my attending physician.  All questions answered to the best of my ability. ? ?Final Clinical Impression(s) / ED Diagnoses ?Final diagnoses:  ?Abnormal visual perception  ? ? ?Rx / DC Orders ?ED Discharge Orders   ? ? None  ? ?  ? ? ?  ?Gailen Shelter, [place] ?03/29/21 1101 ? ?  ?Alvira Monday, MD ?03/29/21 2203 ? ?

## 2021-03-29 NOTE — ED Notes (Signed)
Patient discharge instructions reviewed with the patient. The patient verbalized understanding of instructions. Patient discharged. 

## 2021-03-29 NOTE — Discharge Instructions (Signed)
Please follow-up with the ophthalmologist that you need the information for, you may also call/follow-up with a different ophthalmologist if you prefer.  Please return to the ER for any new or concerning symptoms.  Specifically if you have any numbness weakness slurred speech confusion drooling difficulty walking sudden headaches or vision loss. ?

## 2021-03-29 NOTE — ED Triage Notes (Signed)
Pt arrived POV from home c/o sometimes seeing black spots in her vision but she currently does not see those spots now but thought she should get checked out.  ?

## 2021-10-07 ENCOUNTER — Ambulatory Visit (INDEPENDENT_AMBULATORY_CARE_PROVIDER_SITE_OTHER): Payer: Medicaid Other

## 2021-10-07 ENCOUNTER — Ambulatory Visit (HOSPITAL_COMMUNITY)
Admission: EM | Admit: 2021-10-07 | Discharge: 2021-10-07 | Disposition: A | Discharge status: Home / Self Care | Payer: Medicaid Other | Attending: Physician Assistant | Admitting: Physician Assistant

## 2021-10-07 ENCOUNTER — Encounter (HOSPITAL_COMMUNITY): Payer: Self-pay

## 2021-10-07 DIAGNOSIS — S8001XA Contusion of right knee, initial encounter: Secondary | ICD-10-CM

## 2021-10-07 DIAGNOSIS — M25561 Pain in right knee: Secondary | ICD-10-CM

## 2021-10-07 DIAGNOSIS — W2209XA Striking against other stationary object, initial encounter: Secondary | ICD-10-CM | POA: Diagnosis not present

## 2021-10-07 NOTE — ED Triage Notes (Signed)
Pt states ran into the frame of the door with her rt leg on Saturday night. C/o pain and bruising. Took ibuprofen with no relief.

## 2021-10-07 NOTE — ED Provider Notes (Signed)
[facility]    CSN: [id number] Arrival date & time: 10/07/21  1757      History   Chief Complaint Chief Complaint  Patient presents with   Leg Injury    HPI Meghan Warren is a 26 y.o. female.   Pt complains of right knee pain that started three days ago after she was running and ran into a door frame.  She reports swelling and pain to the lateral aspect of the right knee.  She has tried ibuprofen and ice with minimal improvement     Past Medical History:  Diagnosis Date   Medical history non-contributory     Patient Active Problem List   Diagnosis Date Noted   Normal labor 05/07/2019   COVID-19 01/14/2019   Chlamydia infection affecting pregnancy in first trimester 11/03/2018    Past Surgical History:  Procedure Laterality Date   NO PAST SURGERIES      OB History     Gravida  1   Para  1   Term  1   Preterm  0   AB  0   Living  1      SAB  0   IAB  0   Ectopic  0   Multiple  0   Live Births  1            Home Medications    Prior to Admission medications   Medication Sig Start Date End Date Taking? Authorizing Provider  promethazine-dextromethorphan (PROMETHAZINE-DM) 6.25-15 MG/5ML syrup Take 5 mLs by mouth at bedtime as needed for cough. Patient not taking: Reported on 03/29/2021 04/26/20   Jaynee Eagles, PA-C    Family History Family History  Problem Relation Age of Onset   Hypertension Mother    Healthy Father     Social History Social History   Tobacco Use   Smoking status: Every Day    Packs/day: 0.00    Types: Cigarettes   Smokeless tobacco: Never   Tobacco comments:    1-2 blacks/day  Vaping Use   Vaping Use: Never used  Substance Use Topics   Alcohol use: No   Drug use: Yes    Types: Marijuana    Comment: last used 05/07/19     Allergies   Patient has no known allergies.   Review of Systems Review of Systems   Physical Exam Triage Vital Signs ED Triage Vitals  Enc Vitals Group      BP 10/07/21 1901 100/66     Pulse Rate 10/07/21 1901 75     Resp 10/07/21 1901 18     Temp 10/07/21 1901 98.8 F (37.1 C)     Temp Source 10/07/21 1901 Oral     SpO2 10/07/21 1901 96 %     Weight --      Height --      Head Circumference --      Peak Flow --      Pain Score 10/07/21 1902 8     Pain Loc --      Pain Edu? --      Excl. in Wing? --    No data found.  Updated Vital Signs BP 100/66 (BP Location: Left Arm)   Pulse 75   Temp 98.8 F (37.1 C) (Oral)   Resp 18   SpO2 96%   Breastfeeding No   Visual Acuity Right Eye Distance:   Left Eye Distance:   Bilateral Distance:    Right Eye Near:   Left  Eye Near:    Bilateral Near:     Physical Exam Vitals and nursing note reviewed.  Constitutional:      General: She is not in acute distress.    Appearance: She is well-developed.  HENT:     Head: Normocephalic and atraumatic.  Eyes:     Conjunctiva/sclera: Conjunctivae normal.  Cardiovascular:     Rate and Rhythm: Normal rate and regular rhythm.     Heart sounds: No murmur heard. Pulmonary:     Effort: Pulmonary effort is normal. No respiratory distress.     Breath sounds: Normal breath sounds.  Abdominal:     Palpations: Abdomen is soft.     Tenderness: There is no abdominal tenderness.  Musculoskeletal:        General: No swelling.     Cervical back: Neck supple.       Legs:  Skin:    General: Skin is warm and dry.     Capillary Refill: Capillary refill takes less than 2 seconds.  Neurological:     Mental Status: She is alert.  Psychiatric:        Mood and Affect: Mood normal.      UC Treatments / Results  Labs (all labs ordered are listed, but only abnormal results are displayed) Labs Reviewed - No data to display  EKG   Radiology DG Knee 2 Views Right  Result Date: 10/07/2021 CLINICAL DATA:  Blunt trauma to the right knee with pain, initial encounter EXAM: RIGHT KNEE - 2 VIEW COMPARISON:  None Available. FINDINGS: No evidence of  fracture, dislocation, or joint effusion. No evidence of arthropathy or other focal bone abnormality. Soft tissues are unremarkable. IMPRESSION: No acute abnormality noted. Electronically Signed   By: Inez Catalina M.D.   On: 10/07/2021 19:36    Procedures Procedures (including critical care time)  Medications Ordered in UC Medications - No data to display  Initial Impression / Assessment and Plan / UC Course  I have reviewed the triage vital signs and the nursing notes.  Pertinent labs & imaging results that were available during my care of the patient were reviewed by me and considered in my medical decision making (see chart for details).     Xray negative.  Right knee contusion.  No joint laxity, normal ROM.  Supportive care discussed.  Final Clinical Impressions(s) / UC Diagnoses   Final diagnoses:  Contusion of right knee, initial encounter     Discharge Instructions      Xrays show no broken bones Recommend ice, elevation, ibuprofen as needed      ED Prescriptions   None    PDMP not reviewed this encounter.   Ward, Lenise Arena, PA-C 10/07/21 1943

## 2021-10-07 NOTE — Discharge Instructions (Signed)
Xrays show no broken bones Recommend ice, elevation, ibuprofen as needed

## 2022-08-21 ENCOUNTER — Other Ambulatory Visit: Payer: Self-pay

## 2022-08-21 ENCOUNTER — Encounter (HOSPITAL_COMMUNITY): Payer: Self-pay | Admitting: Emergency Medicine

## 2022-08-21 ENCOUNTER — Emergency Department (HOSPITAL_COMMUNITY)
Admission: EM | Admit: 2022-08-21 | Discharge: 2022-08-21 | Disposition: A | Discharge status: Home / Self Care | Payer: Medicaid Other | Attending: Emergency Medicine | Admitting: Emergency Medicine

## 2022-08-21 DIAGNOSIS — K0889 Other specified disorders of teeth and supporting structures: Secondary | ICD-10-CM | POA: Insufficient documentation

## 2022-08-21 DIAGNOSIS — Z5321 Procedure and treatment not carried out due to patient leaving prior to being seen by health care provider: Secondary | ICD-10-CM | POA: Insufficient documentation

## 2022-08-21 NOTE — ED Triage Notes (Signed)
Patient c/o left upper molar pain x 2 weeks.  Patient tearful in triage.  Patient has not seen a dentist.

## 2022-08-21 NOTE — ED Notes (Signed)
Pt not answering to be roomed .

## 2022-08-21 NOTE — ED Notes (Signed)
Awaiting patient from lobby
